# Patient Record
Sex: Male | Born: 1970 | Race: White | Hispanic: No | Marital: Married | Smoking: Current every day smoker
Health system: Southern US, Community
[De-identification: ages and names within clinical notes are randomized; demographics above are authoritative.]

## PROBLEM LIST (undated history)

## (undated) ENCOUNTER — Emergency Department (HOSPITAL_COMMUNITY): Admission: EM | Payer: Self-pay | Source: Home / Self Care

## (undated) DIAGNOSIS — G4730 Sleep apnea, unspecified: Secondary | ICD-10-CM

## (undated) DIAGNOSIS — J449 Chronic obstructive pulmonary disease, unspecified: Secondary | ICD-10-CM

## (undated) DIAGNOSIS — F419 Anxiety disorder, unspecified: Secondary | ICD-10-CM

## (undated) DIAGNOSIS — M545 Low back pain, unspecified: Secondary | ICD-10-CM

## (undated) DIAGNOSIS — K648 Other hemorrhoids: Secondary | ICD-10-CM

## (undated) DIAGNOSIS — K2100 Gastro-esophageal reflux disease with esophagitis, without bleeding: Secondary | ICD-10-CM

## (undated) DIAGNOSIS — M542 Cervicalgia: Secondary | ICD-10-CM

## (undated) DIAGNOSIS — M549 Dorsalgia, unspecified: Secondary | ICD-10-CM

## (undated) DIAGNOSIS — G8929 Other chronic pain: Secondary | ICD-10-CM

## (undated) DIAGNOSIS — F329 Major depressive disorder, single episode, unspecified: Secondary | ICD-10-CM

## (undated) DIAGNOSIS — F32A Depression, unspecified: Secondary | ICD-10-CM

## (undated) DIAGNOSIS — K219 Gastro-esophageal reflux disease without esophagitis: Secondary | ICD-10-CM

## (undated) HISTORY — DX: Major depressive disorder, single episode, unspecified: F32.9

## (undated) HISTORY — PX: KNEE SURGERY: SHX244

## (undated) HISTORY — DX: Other hemorrhoids: K64.8

## (undated) HISTORY — DX: Chronic obstructive pulmonary disease, unspecified: J44.9

## (undated) HISTORY — DX: Gastro-esophageal reflux disease with esophagitis, without bleeding: K21.00

## (undated) HISTORY — DX: Low back pain, unspecified: M54.50

## (undated) HISTORY — DX: Sleep apnea, unspecified: G47.30

---
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
## 2003-09-02 ENCOUNTER — Ambulatory Visit: Admission: RE | Admit: 2003-09-02 | Discharge: 2003-09-02 | Payer: Self-pay | Admitting: Pulmonary Disease

## 2003-09-06 ENCOUNTER — Ambulatory Visit (HOSPITAL_COMMUNITY): Admission: RE | Admit: 2003-09-06 | Discharge: 2003-09-06 | Payer: Self-pay | Admitting: Pulmonary Disease

## 2003-12-11 ENCOUNTER — Ambulatory Visit (HOSPITAL_COMMUNITY): Admission: RE | Admit: 2003-12-11 | Discharge: 2003-12-11 | Payer: Self-pay | Admitting: Orthopedic Surgery

## 2003-12-21 ENCOUNTER — Encounter (HOSPITAL_COMMUNITY): Admission: RE | Admit: 2003-12-21 | Discharge: 2004-01-20 | Payer: Self-pay | Admitting: Orthopedic Surgery

## 2005-06-25 ENCOUNTER — Ambulatory Visit (HOSPITAL_COMMUNITY): Admission: RE | Admit: 2005-06-25 | Discharge: 2005-06-25 | Payer: Self-pay | Admitting: Pulmonary Disease

## 2005-07-10 ENCOUNTER — Ambulatory Visit (HOSPITAL_COMMUNITY): Admission: RE | Admit: 2005-07-10 | Discharge: 2005-07-10 | Payer: Self-pay | Admitting: Pulmonary Disease

## 2007-10-13 ENCOUNTER — Ambulatory Visit (HOSPITAL_COMMUNITY): Admission: RE | Admit: 2007-10-13 | Discharge: 2007-10-13 | Payer: Self-pay | Admitting: Pulmonary Disease

## 2008-09-11 ENCOUNTER — Ambulatory Visit (HOSPITAL_COMMUNITY): Admission: RE | Admit: 2008-09-11 | Discharge: 2008-09-11 | Payer: Self-pay | Admitting: Pulmonary Disease

## 2008-12-13 ENCOUNTER — Ambulatory Visit (HOSPITAL_COMMUNITY): Admission: RE | Admit: 2008-12-13 | Discharge: 2008-12-13 | Payer: Self-pay | Admitting: Pulmonary Disease

## 2009-01-07 ENCOUNTER — Encounter (HOSPITAL_COMMUNITY): Admission: RE | Admit: 2009-01-07 | Discharge: 2009-02-06 | Payer: Self-pay | Admitting: Pulmonary Disease

## 2009-03-15 ENCOUNTER — Encounter
Admission: RE | Admit: 2009-03-15 | Discharge: 2009-03-15 | Payer: Self-pay | Admitting: Physical Medicine & Rehabilitation

## 2009-07-19 ENCOUNTER — Ambulatory Visit (HOSPITAL_COMMUNITY)
Admission: RE | Admit: 2009-07-19 | Discharge: 2009-07-19 | Payer: Self-pay | Admitting: Physical Medicine and Rehabilitation

## 2010-09-03 ENCOUNTER — Ambulatory Visit (HOSPITAL_COMMUNITY)
Admission: RE | Admit: 2010-09-03 | Discharge: 2010-09-03 | Payer: Self-pay | Source: Home / Self Care | Attending: Pulmonary Disease | Admitting: Pulmonary Disease

## 2010-12-06 ENCOUNTER — Emergency Department (HOSPITAL_COMMUNITY)
Admission: EM | Admit: 2010-12-06 | Discharge: 2010-12-06 | Disposition: A | Discharge status: Home / Self Care | Payer: Medicaid Other | Attending: Emergency Medicine | Admitting: Emergency Medicine

## 2010-12-06 DIAGNOSIS — N489 Disorder of penis, unspecified: Secondary | ICD-10-CM | POA: Insufficient documentation

## 2010-12-06 DIAGNOSIS — Y929 Unspecified place or not applicable: Secondary | ICD-10-CM | POA: Insufficient documentation

## 2010-12-06 DIAGNOSIS — Z79899 Other long term (current) drug therapy: Secondary | ICD-10-CM | POA: Insufficient documentation

## 2010-12-06 DIAGNOSIS — N4889 Other specified disorders of penis: Secondary | ICD-10-CM | POA: Insufficient documentation

## 2010-12-06 DIAGNOSIS — N471 Phimosis: Secondary | ICD-10-CM | POA: Insufficient documentation

## 2010-12-06 DIAGNOSIS — S30860A Insect bite (nonvenomous) of lower back and pelvis, initial encounter: Secondary | ICD-10-CM | POA: Insufficient documentation

## 2010-12-06 DIAGNOSIS — N478 Other disorders of prepuce: Secondary | ICD-10-CM | POA: Insufficient documentation

## 2010-12-06 DIAGNOSIS — W57XXXA Bitten or stung by nonvenomous insect and other nonvenomous arthropods, initial encounter: Secondary | ICD-10-CM | POA: Insufficient documentation

## 2011-01-27 ENCOUNTER — Ambulatory Visit (INDEPENDENT_AMBULATORY_CARE_PROVIDER_SITE_OTHER): Payer: Medicaid Other | Admitting: Orthopedic Surgery

## 2011-01-27 ENCOUNTER — Encounter: Payer: Self-pay | Admitting: Orthopedic Surgery

## 2011-01-27 VITALS — Ht 69.0 in | Wt 209.0 lb

## 2011-01-27 DIAGNOSIS — S40011A Contusion of right shoulder, initial encounter: Secondary | ICD-10-CM | POA: Insufficient documentation

## 2011-01-27 DIAGNOSIS — S40019A Contusion of unspecified shoulder, initial encounter: Secondary | ICD-10-CM

## 2011-01-27 MED ORDER — IBUPROFEN 800 MG PO TABS: 800.0000 mg | ORAL_TABLET | Freq: Three times a day (TID) | ORAL | Status: AC | PRN

## 2011-01-27 MED ORDER — HYDROCODONE-ACETAMINOPHEN 5-325 MG PO TABS: 1.0000 | ORAL_TABLET | Freq: Four times a day (QID) | ORAL | Status: DC | PRN

## 2011-01-27 NOTE — Progress Notes (Signed)
Chief complaint is RIGHT shoulder pain.  History this is a 40 year old male, who was cutting a tree down in the limb hit him in the RIGHT arm near his upper deltoid and shoulder. He's had pain since that time for the last 3-4 months with sharp constant throbbing sensation associated with tingling in the upper arm. It's worse with use is better if he elevates the arm. X-rays have been taken of his neck humerus and shoulder and they were normal. No fracture.  Medical system review was completed and included the following complaints snoring, and anxiety, although systems were negative.  Exam showed stable vital signs normal appearance. He was oriented x3. His mood was flat.  This ambulation was normal.  The RIGHT shoulder was tender over the deltoid and the lateral acromion. His flexion was only 110 his abduction is 70 his external rotation is 40. The shoulder was stable, however. Muscle tone was normal. Internal and external rotation strength is normal. Abduction strength is normal. Skin was intact. Pulses in temperature RIGHT upper extremity normal. Supraclavicular lymph nodes were normal.  He had decreased sensation of the lateral arm suggestive of axillary nerve compression injury. Coordination and balance are normal.  X-rays were reviewed from the hospital include shoulder and humerus. Normal.  Impression contusion, RIGHT shoulder.  Recommend ibuprofen and Norco for pain, along with physical therapy. No followup needed.

## 2011-01-27 NOTE — Patient Instructions (Addendum)
Start PHYSICAL THERAPY x 8 weeks   No return scheduled

## 2011-02-06 NOTE — Op Note (Signed)
NAME:  Chad Glass, Chad Glass                         ACCOUNT NO.:  [id number]   MEDICAL RECORD NO.:  [id number]                   PATIENT TYPE:  AMB   LOCATION:  DAY                                  FACILITY:  [facility]   PHYSICIAN:  Vickki Hearing, M.D.           DATE OF BIRTH:  08/27/1971   DATE OF PROCEDURE:  DATE OF DISCHARGE:                                 OPERATIVE REPORT   PREOPERATIVE DIAGNOSIS:  Medial meniscal tear, right knee.   POSTOPERATIVE DIAGNOSIS:  Medial meniscal tear, right knee.   PROCEDURE:  1. Arthroscopy.  2. Partial medial meniscectomy.   SURGEON:  Vickki Hearing, M.D.   ANESTHETIC:  General.   ARTHROSCOPIC FINDINGS:  Torn posterior horn medial meniscus.   HISTORY:  A 40 year old male with right knee pain treated conservatively,  initially, did not improve.  MRI was obtained and showed a torn medial  meniscus; presented for surgery.   INDICATIONS:  Pain and knee dysfunction.   DESCRIPTION OF PROCEDURE:  [name] was identified in the holding area. I  placed my initials over his marked surgical site which was the right knee.  We reviewed his medical record.  He signed a consent for right knee  arthroscopy.  His arm band was used to identify him.  He was given Ancef;  taken to operating room for general anesthetic. He had a sterile prep and  drape.  We took a time out to confirm the patient's identity, procedure, and  limb; everyone agreed; we proceeded with right knee arthroscopy.   Using a 3-incision technique we did a diagnostic arthroscopy.  The only  pathologic findings was a torn medial meniscus.  We used the straight punch  forceps to morselize the meniscal tear and then a aggressive meniscus blade  to clean up the debris and balance the meniscus.  A probe was used to test  the stability of the remaining rim. It was  stable.  The knee was irrigated, suctioned, and closed with Steri-Strips.  We injected 30 cc of Sensorcaine; applied a  CryoCuff, over a sterile  dressing; extubated him and took him to recovery room in stable condition.   POSTOP PLAN:  Full weightbearing, CryoCuff therapy, follow up in 2 days for  dressing change.     ___________________________________________                                            Vickki Hearing, M.D.   SEH/MEDQ  D:  12/11/2003  T:  12/11/2003  Job:  [id number]

## 2011-10-09 ENCOUNTER — Other Ambulatory Visit: Payer: Self-pay | Admitting: Orthopedic Surgery

## 2011-11-23 ENCOUNTER — Encounter (HOSPITAL_COMMUNITY): Payer: Self-pay

## 2011-11-23 ENCOUNTER — Encounter (HOSPITAL_COMMUNITY): Payer: Self-pay | Admitting: Pharmacy Technician

## 2011-11-23 ENCOUNTER — Encounter (HOSPITAL_COMMUNITY)
Admission: RE | Admit: 2011-11-23 | Discharge: 2011-11-23 | Disposition: A | Discharge status: Home / Self Care | Payer: Medicaid Other | Source: Ambulatory Visit | Attending: General Surgery | Admitting: General Surgery

## 2011-11-23 HISTORY — DX: Gastro-esophageal reflux disease without esophagitis: K21.9

## 2011-11-23 HISTORY — DX: Cervicalgia: M54.2

## 2011-11-23 HISTORY — DX: Anxiety disorder, unspecified: F41.9

## 2011-11-23 HISTORY — DX: Dorsalgia, unspecified: M54.9

## 2011-11-23 HISTORY — DX: Depression, unspecified: F32.A

## 2011-11-23 HISTORY — DX: Other chronic pain: G89.29

## 2011-11-23 HISTORY — DX: Major depressive disorder, single episode, unspecified: F32.9

## 2011-11-23 HISTORY — DX: Sleep apnea, unspecified: G47.30

## 2011-11-23 LAB — DIFFERENTIAL
Basophils Relative: 0 % (ref 0–1)
Eosinophils Absolute: 0.2 10*3/uL (ref 0.0–0.7)
Lymphs Abs: 3.3 10*3/uL (ref 0.7–4.0)
Monocytes Relative: 6 % (ref 3–12)
Neutro Abs: 4.2 10*3/uL (ref 1.7–7.7)
Neutrophils Relative %: 52 % (ref 43–77)

## 2011-11-23 LAB — SURGICAL PCR SCREEN
MRSA, PCR: NEGATIVE
Staphylococcus aureus: NEGATIVE

## 2011-11-23 LAB — BASIC METABOLIC PANEL
BUN: 13 mg/dL (ref 6–23)
CO2: 21 mEq/L (ref 19–32)
Calcium: 9.3 mg/dL (ref 8.4–10.5)
Chloride: 106 mEq/L (ref 96–112)
Creatinine, Ser: 1.03 mg/dL (ref 0.50–1.35)
GFR calc Af Amer: >90 mL/min (ref >90)
GFR calc non Af Amer: 89 mL/min — ABNORMAL LOW (ref >90)
Glucose, Bld: 91 mg/dL (ref 70–99)
Potassium: 3.6 mEq/L (ref 3.5–5.1)
Sodium: 139 mEq/L (ref 135–145)

## 2011-11-23 LAB — CBC
Hemoglobin: 14.7 g/dL (ref 13.0–17.0)
MCH: 31.3 pg (ref 26.0–34.0)
MCHC: 33.7 g/dL (ref 30.0–36.0)
Platelets: 216 10*3/uL (ref 150–400)
RBC: 4.69 MIL/uL (ref 4.22–5.81)

## 2011-11-23 NOTE — Patient Instructions (Addendum)
[address]  11/23/2011   Your procedure is scheduled on:  11-30-11  Report to [facility] at 06:15 AM.  Call this number if you have problems the morning of surgery: [phone]   Remember:   Do not eat food:After Midnight.  May have clear liquids:until Midnight .  Clear liquids include soda, tea, black coffee, apple or grape juice, broth.  Take these medicines the morning of surgery with A SIP OF WATER: xanax   Do not wear jewelry, make-up or nail polish.  Do not wear lotions, powders, or perfumes. You may wear deodorant.  Do not shave 48 hours prior to surgery.  Do not bring valuables to the hospital.  Contacts, dentures or bridgework may not be worn into surgery.  Leave suitcase in the car. After surgery it may be brought to your room.  For patients admitted to the hospital, checkout time is 11:00 AM the day of discharge.   Patients discharged the day of surgery will not be allowed to drive home.  Name and phone number of your driver: family  Special Instructions: CHG Shower Use Special Wash: 1/2 bottle night before surgery and 1/2 bottle morning of surgery.   Please read over the following fact sheets that you were given: Pain Booklet, MRSA Information, Surgical Site Infection Prevention, Anesthesia Post-op Instructions and Care and Recovery After Surgery    PATIENT INSTRUCTIONS POST-ANESTHESIA  IMMEDIATELY FOLLOWING SURGERY:  Do not drive or operate machinery for the first twenty four hours after surgery.  Do not make any important decisions for twenty four hours after surgery or while taking narcotic pain medications or sedatives.  If you develop intractable nausea and vomiting or a severe headache please notify your doctor immediately.  FOLLOW-UP:  Please make an appointment with your surgeon as instructed. You do not need to follow up with anesthesia unless specifically instructed to do so.  WOUND CARE INSTRUCTIONS (if applicable):  Keep a dry clean dressing on the  anesthesia/puncture wound site if there is drainage.  Once the wound has quit draining you may leave it open to air.  Generally you should leave the bandage intact for twenty four hours unless there is drainage.  If the epidural site drains for more than 36-48 hours please call the anesthesia department.  QUESTIONS?:  Please feel free to call your physician or the hospital operator if you have any questions, and they will be happy to assist you.     [facility] Anesthesia Department [address] [place] [phone]

## 2011-11-23 NOTE — H&P (Signed)
  NTS SOAP Note  Vital Signs:  Vitals as of: 11/17/2011: Systolic 145: Diastolic 86: Heart Rate 78: Temp 31F: Height 42ft 9in: Weight 240Lbs 0 Ounces: OFC 0in: Respiratory Rate 0: O2 Saturation 0: Pain Level 4: BMI 35  BMI : 35.44 kg/m2  Subjective: This 41 Years 50 Months old Male presents for of Abdominal bulge in his bellybutton.Patient noted this approximately 6-8 months ago. He states he was in the shower when he noticed a hard lump by his bellybutton. This intermittently gets softer and more firm. He does occasionally have some discomfort with excessive straining. He's had no associated symptomatologies and nausea, vomiting, bowel changes. No melena no hematochezia. No signs or symptoms of incarceration or strangulation.  Review of Symptoms:  Constitutional:unremarkable Headaches Eyes:vision problems night Nose/Mouth/Throat:unremarkable Cardiovascular:unremarkable Short of breath Gastrointestinal:unremarkable Genitourinary:unremarkable Neck, back, joint arthralgias Skin:unremarkable Breast:unremarkable Tired, Easily fatigued Allergic/Immunologic:unremarkable   Past Medical History:Obtained   Past Medical History  Surgical History: Right knee arthroscopy Medical Problems: none Psychiatric History: none Allergies: NKDA Medications: oxycodone   Social History:Obtained   Social History  Preferred Language: English ([place]) Race:  White Ethnicity: Not Hispanic / Latino Age: 41 Years 4 Months Alcohol: no Recreational drug(s): no   Smoking Status: Current every day smoker reviewed on 11/18/2011 Started Date: 09/22/1987 Packs per day: 1.50   Family History:Obtained   Family History              Father: DM2    Objective Information: General:Well appearing, well nourished in no distress.Moderately obese. Disheveled Skin:no rash or prominent lesions Head:Atraumatic; no masses; no  abnormalities Eyes:conjunctiva clear, EOM intact, PERRL Mouth:Mucous membranes moist, no mucosal lesions. Neck:Supple without lymphadenopathy.  Heart:RRR, no murmur Lungs:CTA bilaterally, no wheezes, rhonchi, rales.  Breathing unlabored. Abdomen:Soft, NT/ND, no HSM, no masses.Patient has a moderate size reducible umbilical hernia. Mild discomfort with reduction of the hernia. No diffuse peritoneal signs. No inguinal hernias apparent. Normal male appearing external genitalia. Extremities:No deformities, clubbing, cyanosis, or edema.   Assessment:  Diagnosis &amp; Procedure: DiagnosisCode: 553.1, ProcedureCode: 84696,    Plan: Umbilical hernia. Risks benefits alternatives of an umbilical hernia repair with possible mesh were discussed at length the patient. Indications to proceed to the operating room were discussed. Patient will consider timing and we'll schedule at his convenience. Signs and symptoms of incarceration and strangulation were discussed with the patient patient understands to present to the emergency department should the symptoms appear.  Patient Education:Alternative treatments to surgery were discussed with patient (and family).Risks and benefits  of procedure were fully explained to the patient (and family) who gave informed consent. Patient/family questions were addressed.  Follow-up:Pending Surgery                                             Active Diagnosis and Procedures: 553.1 Umbilical hernia without mention of obstruction or gangrene   29528 - OFFICE OUTPATIENT NEW 30 MINUTES        This note has been electronically signed by Fabio Bering MD 11/18/2011 10:14 AM

## 2011-11-30 ENCOUNTER — Ambulatory Visit (HOSPITAL_COMMUNITY): Payer: Medicaid Other | Admitting: Anesthesiology

## 2011-11-30 ENCOUNTER — Encounter (HOSPITAL_COMMUNITY): Payer: Self-pay | Admitting: Anesthesiology

## 2011-11-30 ENCOUNTER — Encounter (HOSPITAL_COMMUNITY)
Admission: RE | Disposition: A | Discharge status: Home / Self Care | Payer: Self-pay | Source: Ambulatory Visit | Attending: General Surgery

## 2011-11-30 ENCOUNTER — Ambulatory Visit (HOSPITAL_COMMUNITY)
Admission: RE | Admit: 2011-11-30 | Discharge: 2011-11-30 | Disposition: A | Discharge status: Home / Self Care | Payer: Medicaid Other | Source: Ambulatory Visit | Attending: General Surgery | Admitting: General Surgery

## 2011-11-30 ENCOUNTER — Encounter (HOSPITAL_COMMUNITY): Payer: Self-pay | Admitting: *Deleted

## 2011-11-30 DIAGNOSIS — Z01812 Encounter for preprocedural laboratory examination: Secondary | ICD-10-CM | POA: Insufficient documentation

## 2011-11-30 DIAGNOSIS — K429 Umbilical hernia without obstruction or gangrene: Secondary | ICD-10-CM | POA: Insufficient documentation

## 2011-11-30 HISTORY — PX: UMBILICAL HERNIA REPAIR: SHX196

## 2011-11-30 SURGERY — REPAIR, HERNIA, UMBILICAL, ADULT
Anesthesia: General | Site: Abdomen | Wound class: Clean

## 2011-11-30 MED ORDER — HYDROCODONE-ACETAMINOPHEN 5-325 MG PO TABS: 1.0000 | ORAL_TABLET | ORAL | Status: AC | PRN

## 2011-11-30 MED ORDER — BUPIVACAINE HCL (PF) 0.5 % IJ SOLN
INTRAMUSCULAR | Status: AC
  Filled 2011-11-30: qty 30

## 2011-11-30 MED ORDER — PROPOFOL 10 MG/ML IV BOLUS
INTRAVENOUS | Status: DC | PRN
  Administered 2011-11-30: 160 mg via INTRAVENOUS

## 2011-11-30 MED ORDER — LIDOCAINE HCL (PF) 1 % IJ SOLN
INTRAMUSCULAR | Status: AC
  Filled 2011-11-30: qty 5

## 2011-11-30 MED ORDER — BUPIVACAINE HCL (PF) 0.5 % IJ SOLN
INTRAMUSCULAR | Status: DC | PRN
  Administered 2011-11-30: 10 mL

## 2011-11-30 MED ORDER — CELECOXIB 100 MG PO CAPS
ORAL_CAPSULE | ORAL | Status: AC
  Administered 2011-11-30: 400 mg via ORAL
  Filled 2011-11-30: qty 4

## 2011-11-30 MED ORDER — CELECOXIB 100 MG PO CAPS
400.0000 mg | ORAL_CAPSULE | Freq: Every day | ORAL | Status: AC
  Administered 2011-11-30: 400 mg via ORAL

## 2011-11-30 MED ORDER — CEFAZOLIN SODIUM 1-5 GM-% IV SOLN: 1.0000 g | INTRAVENOUS | Status: DC

## 2011-11-30 MED ORDER — MIDAZOLAM HCL 2 MG/2ML IJ SOLN
INTRAMUSCULAR | Status: AC
  Administered 2011-11-30: 2 mg via INTRAVENOUS
  Filled 2011-11-30: qty 2

## 2011-11-30 MED ORDER — ONDANSETRON HCL 4 MG/2ML IJ SOLN: 4.0000 mg | Freq: Once | INTRAMUSCULAR | Status: DC | PRN

## 2011-11-30 MED ORDER — FENTANYL CITRATE 0.05 MG/ML IJ SOLN
25.0000 ug | INTRAMUSCULAR | Status: DC | PRN
  Administered 2011-11-30 (×2): 50 ug via INTRAVENOUS

## 2011-11-30 MED ORDER — CEFAZOLIN SODIUM 1-5 GM-% IV SOLN
INTRAVENOUS | Status: DC | PRN
  Administered 2011-11-30: 1 g via INTRAVENOUS

## 2011-11-30 MED ORDER — NEOSTIGMINE METHYLSULFATE 1 MG/ML IJ SOLN
INTRAMUSCULAR | Status: AC
  Filled 2011-11-30: qty 10

## 2011-11-30 MED ORDER — MIDAZOLAM HCL 5 MG/5ML IJ SOLN
INTRAMUSCULAR | Status: DC | PRN
  Administered 2011-11-30: 2 mg via INTRAVENOUS

## 2011-11-30 MED ORDER — GLYCOPYRROLATE 0.2 MG/ML IJ SOLN
INTRAMUSCULAR | Status: DC | PRN
  Administered 2011-11-30: .4 mg via INTRAVENOUS

## 2011-11-30 MED ORDER — NEOSTIGMINE METHYLSULFATE 1 MG/ML IJ SOLN
INTRAMUSCULAR | Status: DC | PRN
  Administered 2011-11-30: 3 mg via INTRAVENOUS

## 2011-11-30 MED ORDER — ROCURONIUM BROMIDE 100 MG/10ML IV SOLN
INTRAVENOUS | Status: DC | PRN
  Administered 2011-11-30: 30 mg via INTRAVENOUS

## 2011-11-30 MED ORDER — ENOXAPARIN SODIUM 40 MG/0.4ML ~~LOC~~ SOLN
40.0000 mg | Freq: Once | SUBCUTANEOUS | Status: AC
  Administered 2011-11-30: 40 mg via SUBCUTANEOUS

## 2011-11-30 MED ORDER — FENTANYL CITRATE 0.05 MG/ML IJ SOLN
INTRAMUSCULAR | Status: AC
  Filled 2011-11-30: qty 2

## 2011-11-30 MED ORDER — ROCURONIUM BROMIDE 50 MG/5ML IV SOLN
INTRAVENOUS | Status: AC
  Filled 2011-11-30: qty 1

## 2011-11-30 MED ORDER — FENTANYL CITRATE 0.05 MG/ML IJ SOLN
INTRAMUSCULAR | Status: DC | PRN
  Administered 2011-11-30 (×2): 100 ug via INTRAVENOUS

## 2011-11-30 MED ORDER — SODIUM CHLORIDE 0.9 % IR SOLN
Status: DC | PRN
  Administered 2011-11-30: 1000 mL

## 2011-11-30 MED ORDER — MIDAZOLAM HCL 2 MG/2ML IJ SOLN
INTRAMUSCULAR | Status: AC
  Filled 2011-11-30: qty 2

## 2011-11-30 MED ORDER — GLYCOPYRROLATE 0.2 MG/ML IJ SOLN
INTRAMUSCULAR | Status: AC
  Filled 2011-11-30: qty 1

## 2011-11-30 MED ORDER — ENOXAPARIN SODIUM 40 MG/0.4ML ~~LOC~~ SOLN
SUBCUTANEOUS | Status: AC
  Administered 2011-11-30: 40 mg via SUBCUTANEOUS
  Filled 2011-11-30: qty 0.4

## 2011-11-30 MED ORDER — PROPOFOL 10 MG/ML IV EMUL
INTRAVENOUS | Status: AC
  Filled 2011-11-30: qty 20

## 2011-11-30 MED ORDER — LACTATED RINGERS IV SOLN
INTRAVENOUS | Status: DC
  Administered 2011-11-30: 1000 mL via INTRAVENOUS

## 2011-11-30 MED ORDER — FENTANYL CITRATE 0.05 MG/ML IJ SOLN
INTRAMUSCULAR | Status: AC
  Administered 2011-11-30: 50 ug via INTRAVENOUS
  Filled 2011-11-30: qty 2

## 2011-11-30 MED ORDER — LIDOCAINE HCL 1 % IJ SOLN
INTRAMUSCULAR | Status: DC | PRN
  Administered 2011-11-30: 25 mg via INTRADERMAL

## 2011-11-30 MED ORDER — CEFAZOLIN SODIUM 1-5 GM-% IV SOLN
INTRAVENOUS | Status: AC
  Filled 2011-11-30: qty 50

## 2011-11-30 MED ORDER — MIDAZOLAM HCL 2 MG/2ML IJ SOLN
1.0000 mg | INTRAMUSCULAR | Status: DC | PRN
  Administered 2011-11-30: 2 mg via INTRAVENOUS

## 2011-11-30 SURGICAL SUPPLY — 33 items
BAG HAMPER (MISCELLANEOUS) ×2 IMPLANT
BENZOIN TINCTURE PRP APPL 2/3 (GAUZE/BANDAGES/DRESSINGS) ×2 IMPLANT
CLOTH BEACON ORANGE TIMEOUT ST (SAFETY) ×2 IMPLANT
COVER LIGHT HANDLE STERIS (MISCELLANEOUS) ×4 IMPLANT
DECANTER SPIKE VIAL GLASS SM (MISCELLANEOUS) ×2 IMPLANT
DURAPREP 26ML APPLICATOR (WOUND CARE) ×2 IMPLANT
ELECT REM PT RETURN 9FT ADLT (ELECTROSURGICAL) ×2
ELECTRODE REM PT RTRN 9FT ADLT (ELECTROSURGICAL) ×1 IMPLANT
GLOVE BIOGEL PI IND STRL 7.5 (GLOVE) ×1 IMPLANT
GLOVE BIOGEL PI INDICATOR 7.5 (GLOVE) ×1
GLOVE ECLIPSE 6.5 STRL STRAW (GLOVE) ×2 IMPLANT
GLOVE ECLIPSE 7.0 STRL STRAW (GLOVE) ×2 IMPLANT
GLOVE EXAM NITRILE MD LF STRL (GLOVE) ×2 IMPLANT
GLOVE INDICATOR 7.0 STRL GRN (GLOVE) ×2 IMPLANT
GOWN STRL REIN XL XLG (GOWN DISPOSABLE) ×4 IMPLANT
INST SET MINOR GENERAL (KITS) ×2 IMPLANT
KIT ROOM TURNOVER APOR (KITS) ×2 IMPLANT
MANIFOLD NEPTUNE II (INSTRUMENTS) ×2 IMPLANT
NEEDLE HYPO 25X1 1.5 SAFETY (NEEDLE) ×2 IMPLANT
NS IRRIG 1000ML POUR BTL (IV SOLUTION) ×2 IMPLANT
PACK MINOR (CUSTOM PROCEDURE TRAY) ×2 IMPLANT
PAD ARMBOARD 7.5X6 YLW CONV (MISCELLANEOUS) ×2 IMPLANT
SET BASIN LINEN APH (SET/KITS/TRAYS/PACK) ×2 IMPLANT
STRIP CLOSURE SKIN 1/2X4 (GAUZE/BANDAGES/DRESSINGS) ×2 IMPLANT
SUT ETHIBOND CT1 BRD 2-0 30IN (SUTURE) IMPLANT
SUT ETHIBOND NAB MO 7 #0 18IN (SUTURE) ×2 IMPLANT
SUT MNCRL AB 4-0 PS2 18 (SUTURE) ×2 IMPLANT
SUT VIC AB 2-0 CT2 27 (SUTURE) ×2 IMPLANT
SUT VIC AB 3-0 SH 27 (SUTURE) ×1
SUT VIC AB 3-0 SH 27X BRD (SUTURE) ×1 IMPLANT
SUT VICRYL AB 3 0 TIES (SUTURE) IMPLANT
SYR BULB IRRIGATION 50ML (SYRINGE) IMPLANT
SYR CONTROL 10ML LL (SYRINGE) ×2 IMPLANT

## 2011-11-30 NOTE — Anesthesia Procedure Notes (Signed)
Procedure Name: Intubation Date/Time: 11/30/2011 7:59 AM Performed by: Despina Hidden Pre-anesthesia Checklist: Patient identified, Patient being monitored, Emergency Drugs available and Suction available Patient Re-evaluated:Patient Re-evaluated prior to inductionOxygen Delivery Method: Circle system utilized Preoxygenation: Pre-oxygenation with 100% oxygen Intubation Type: IV induction and Cricoid Pressure applied Ventilation: Mask ventilation without difficulty Laryngoscope Size: 3 and Mac Grade View: Grade I Tube type: Oral Tube size: 8.0 mm Number of attempts: 1 Airway Equipment and Method: Stylet Placement Confirmation: positive ETCO2,  breath sounds checked- equal and bilateral and ETT inserted through vocal cords under direct vision Secured at: 22 cm Tube secured with: Tape Dental Injury: Teeth and Oropharynx as per pre-operative assessment

## 2011-11-30 NOTE — Anesthesia Preprocedure Evaluation (Signed)
Anesthesia Evaluation  Patient identified by MRN, date of birth, ID band Patient awake    Reviewed: Allergy & Precautions, H&P , NPO status , Patient's Chart, lab work & pertinent test results  History of Anesthesia Complications Negative for: history of anesthetic complications  Airway Mallampati: II      Dental  (+) Poor Dentition, Chipped, Missing and Dental Advisory Given   Pulmonary sleep apnea and Continuous Positive Airway Pressure Ventilation , Current Smoker,  breath sounds clear to auscultation        Cardiovascular negative cardio ROS  Rhythm:Regular     Neuro/Psych PSYCHIATRIC DISORDERS Anxiety Depression    GI/Hepatic GERD-  ,  Endo/Other    Renal/GU      Musculoskeletal   Abdominal   Peds  Hematology   Anesthesia Other Findings   Reproductive/Obstetrics                           Anesthesia Physical Anesthesia Plan  ASA: III  Anesthesia Plan: General   Post-op Pain Management:    Induction: Intravenous, Rapid sequence and Cricoid pressure planned  Airway Management Planned: Oral ETT  Additional Equipment:   Intra-op Plan:   Post-operative Plan:   Informed Consent: I have reviewed the patients History and Physical, chart, labs and discussed the procedure including the risks, benefits and alternatives for the proposed anesthesia with the patient or authorized representative who has indicated his/her understanding and acceptance.     Plan Discussed with:   Anesthesia Plan Comments:         Anesthesia Quick Evaluation

## 2011-11-30 NOTE — Progress Notes (Signed)
Awake. Talking. Denies pain. "I just want a cigarette."  Wants to go home.

## 2011-11-30 NOTE — Anesthesia Postprocedure Evaluation (Signed)
  Anesthesia Post-op Note  Patient: Chad Glass  Procedure(s) Performed: Procedure(s) (LRB): HERNIA REPAIR UMBILICAL ADULT (N/A)  Patient Location: PACU  Anesthesia Type: General  Level of Consciousness: awake, alert , oriented and patient cooperative  Airway and Oxygen Therapy: Patient Spontanous Breathing  Post-op Pain: 2 /10, mild  Post-op Assessment: Post-op Vital signs reviewed, Patient's Cardiovascular Status Stable, Respiratory Function Stable, Patent Airway and No signs of Nausea or vomiting  Post-op Vital Signs: Reviewed and stable  Complications: No apparent anesthesia complications

## 2011-11-30 NOTE — Interval H&P Note (Signed)
History and Physical Interval Note:  11/30/2011 7:49 AM  Chad Glass  has presented today for surgery, with the diagnosis of Umbilical hernia  The various methods of treatment have been discussed with the patient and family. After consideration of risks, benefits and other options for treatment, the patient has consented to  Procedure(s) (LRB): HERNIA REPAIR UMBILICAL ADULT (N/A) as a surgical intervention .  The patients' history has been reviewed, patient examined, no change in status, stable for surgery.  I have reviewed the patients' chart and labs.  Questions were answered to the patient's satisfaction.     Kelsie Zaborowski C

## 2011-11-30 NOTE — Transfer of Care (Signed)
Immediate Anesthesia Transfer of Care Note  Patient: Chad Glass  Procedure(s) Performed: Procedure(s) (LRB): HERNIA REPAIR UMBILICAL ADULT (N/A)  Patient Location: PACU  Anesthesia Type: General  Level of Consciousness: awake and patient cooperative  Airway & Oxygen Therapy: Patient Spontanous Breathing and Patient connected to face mask oxygen  Post-op Assessment: Report given to PACU RN, Post -op Vital signs reviewed and stable and Patient moving all extremities  Post vital signs: Reviewed and stable  Complications: No apparent anesthesia complications

## 2011-11-30 NOTE — Op Note (Signed)
Patient:  Chad Glass  DOB:  1971/06/27  MRN:  [id number]   Preop Diagnosis:  Umbilical hernia  Postop Diagnosis:  The same  Procedure:  Umbilical hernia repair  Surgeon:  Dr. Tilford Pillar  Anes:  General endotracheal, 0.5% Sensorcaine plain for local  Indications:  Patient is a 41 year old male presented my office with a bulge above his umbilicus. He's had some discomfort in this area. Evaluation was consistent for an umbilical hernia. Risks benefits alternatives of repair were discussed at length the patient including but not limited to risk of bleeding, infection, infection and mesh requiring removal if mesh was used, recurrence, intraoperative cardiac and pulmonary events.  Patient was consented for the planned procedure  Procedure note:  Patient was taken to the orin the supine position the operator table time the general anesthetic was a Optician, dispensing. Once patient was asleep he was endotracheally intubated by the nurse anesthetist. At this point his abdomen is prepped with DuraPrep solution and draped in standard fashion. A semilunar incision was created infraumbilically with a 15 blade scalpel. Additional dissection down to subcuticular tissues carried out using electrocautery. Upon entering the fascia did dissect around the umbilical stalk and hernia with a hemostat. Careful dissection was carried out to dissect the hernia sac off the umbilical stalk. During this dissection I did enter into the hernia sac and identified some omental fat. Upon completely freeing the fascial defect circumferentially to reduce the omental fat back into the abdominal cavity. The fascia defect was less than 1 cm I opted at this point to close the defect with interrupted 0 Ethibond sutures. These are placed in simple interrupted fashion. As quite pleased with the repair. The field was irrigated. A 3-0 Vicryl was utilized to reattach the umbilical stalk to the fascia. A 3-0 Vicryl was also utilized in simple or  fashion to reapproximate the deep subcuticular tissue. The skin edges were reapproximated using a 4-0 Monocryl in a running subcuticular suture. The skin was washed dried moist dry towel after the local anesthetic was instilled. Benzoin is applied around incision. Half-inch Steri-Strips are placed. The drapes removed the patient was allowed to mild general anesthetic was transferred to the postanesthetic care unit in stable condition. At the conclusion of procedure all instrument, sponge, needle counts are correct. Patient tolerated procedure extremely well.  Complications:  None apparent  EBL:  Scant  Specimen:  None

## 2011-12-03 ENCOUNTER — Encounter (HOSPITAL_COMMUNITY): Payer: Self-pay | Admitting: General Surgery

## 2015-09-10 LAB — LIPID PANEL
Cholesterol: 135 (ref 0–200)
HDL: 28 — AB (ref 35–70)
LDL Cholesterol: 93
Triglycerides: 68 (ref 40–160)

## 2015-09-10 LAB — PSA: PSA: 0.82

## 2016-02-07 LAB — BASIC METABOLIC PANEL
BUN: 10 (ref 4–21)
CO2: 24 — AB (ref 13–22)
Chloride: 111 — AB (ref 99–108)
Creatinine: 1.1 (ref <=1.3)
Glucose: 130
Potassium: 4.2 (ref 3.4–5.3)
Sodium: 145 (ref 137–147)

## 2016-02-07 LAB — HEPATIC FUNCTION PANEL
ALT: 21 (ref 10–40)
AST: 20 (ref 14–40)
Alkaline Phosphatase: 76 (ref 25–125)
Bilirubin, Total: 0.7

## 2016-02-07 LAB — CBC AND DIFFERENTIAL
HCT: 47 (ref 41–53)
Hemoglobin: 16.2 (ref 13.5–17.5)
Neutrophils Absolute: 5670
Platelets: 223 (ref 150–399)
WBC: 9

## 2016-02-07 LAB — VITAMIN B12: Vitamin B-12: 534

## 2016-02-07 LAB — CBC: RBC: 4.98 (ref 3.87–5.11)

## 2016-02-07 LAB — HEMOGLOBIN A1C: Hemoglobin A1C: 5.8

## 2016-03-04 ENCOUNTER — Other Ambulatory Visit (HOSPITAL_COMMUNITY): Payer: Self-pay | Admitting: Pulmonary Disease

## 2016-03-04 DIAGNOSIS — R4182 Altered mental status, unspecified: Secondary | ICD-10-CM

## 2016-05-15 ENCOUNTER — Encounter (HOSPITAL_COMMUNITY): Payer: Self-pay

## 2016-05-15 ENCOUNTER — Ambulatory Visit (HOSPITAL_COMMUNITY)
Admission: RE | Admit: 2016-05-15 | Discharge: 2016-05-15 | Disposition: A | Discharge status: Home / Self Care | Payer: Medicaid Other | Source: Ambulatory Visit | Attending: Pulmonary Disease | Admitting: Pulmonary Disease

## 2016-05-15 DIAGNOSIS — J328 Other chronic sinusitis: Secondary | ICD-10-CM | POA: Insufficient documentation

## 2016-05-15 DIAGNOSIS — R4182 Altered mental status, unspecified: Secondary | ICD-10-CM | POA: Diagnosis not present

## 2016-11-27 ENCOUNTER — Ambulatory Visit (HOSPITAL_COMMUNITY)
Admission: RE | Admit: 2016-11-27 | Discharge: 2016-11-27 | Disposition: A | Discharge status: Home / Self Care | Payer: Medicaid Other | Source: Ambulatory Visit | Attending: Pulmonary Disease | Admitting: Pulmonary Disease

## 2016-11-27 ENCOUNTER — Other Ambulatory Visit (HOSPITAL_COMMUNITY): Payer: Self-pay | Admitting: Pulmonary Disease

## 2016-11-27 DIAGNOSIS — M545 Low back pain: Secondary | ICD-10-CM | POA: Diagnosis present

## 2016-11-27 DIAGNOSIS — M47812 Spondylosis without myelopathy or radiculopathy, cervical region: Secondary | ICD-10-CM | POA: Insufficient documentation

## 2016-11-27 DIAGNOSIS — M25511 Pain in right shoulder: Secondary | ICD-10-CM | POA: Insufficient documentation

## 2016-11-27 DIAGNOSIS — M5405 Panniculitis affecting regions of neck and back, thoracolumbar region: Secondary | ICD-10-CM | POA: Diagnosis not present

## 2016-11-27 DIAGNOSIS — R52 Pain, unspecified: Secondary | ICD-10-CM

## 2019-03-08 NOTE — Progress Notes (Deleted)
Psychiatric Initial Adult Assessment   Patient Identification: Chad Glass MRN:  [id number] Date of Evaluation:  03/08/2019 Referral Source: Sinda Du, MD Chief Complaint:   Visit Diagnosis: No diagnosis found.  History of Present Illness:   Chad Glass is a 48 y.o. year old male with a history of depression, who is referred for   Xanax 1 mg QID  Associated Signs/Symptoms: Depression Symptoms:  {DEPRESSION SYMPTOMS:20000} (Hypo) Manic Symptoms:  {BHH MANIC SYMPTOMS:22872} Anxiety Symptoms:  {BHH ANXIETY SYMPTOMS:22873} Psychotic Symptoms:  {BHH PSYCHOTIC SYMPTOMS:22874} PTSD Symptoms: {BHH PTSD SYMPTOMS:22875}  Past Psychiatric History:  Outpatient:  Psychiatry admission:  Previous suicide attempt:  Past trials of medication:  History of violence:   Previous Psychotropic Medications: {YES/NO:21197}  Substance Abuse History in the last 12 months:  {yes no:314532}  Consequences of Substance Abuse: {BHH CONSEQUENCES OF SUBSTANCE ABUSE:22880}  Past Medical History:  Past Medical History:  Diagnosis Date  . Anxiety   . Chronic back pain   . Chronic neck pain   . Depression   . GERD (gastroesophageal reflux disease)   . Sleep apnea    CiPaP at night    Past Surgical History:  Procedure Laterality Date  . KNEE SURGERY     right-cut around the cartilage. Dr. Aline Brochure at [place]  11/30/2011   Procedure: HERNIA REPAIR UMBILICAL ADULT;  Surgeon: Donato Heinz, MD;  Location: [facility];  Service: General;  Laterality: N/A;    Family Psychiatric History: ***  Family History:  Family History  Problem Relation Age of Onset  . Arthritis Unknown   . Diabetes Unknown   . Anesthesia problems Neg Hx   . Hypotension Neg Hx   . Pseudochol deficiency Neg Hx   . Malignant hyperthermia Neg Hx     Social History:   Social History   Socioeconomic History  . Marital status: Married    Spouse name: Not on file  . Number of  children: Not on file  . Years of education: 55  . Highest education level: Not on file  Occupational History  . Occupation: unemployed    Fish farm manager: UNEMPLOYED  Social Needs  . Financial resource strain: Not on file  . Food insecurity    Worry: Not on file    Inability: Not on file  . Transportation needs    Medical: Not on file    Non-medical: Not on file  Tobacco Use  . Smoking status: Current Every Day Smoker    Packs/day: 0.75    Years: 30.00    Pack years: 22.50    Types: Cigarettes  Substance and Sexual Activity  . Alcohol use: No  . Drug use: No  . Sexual activity: Not on file  Lifestyle  . Physical activity    Days per week: Not on file    Minutes per session: Not on file  . Stress: Not on file  Relationships  . Social Herbalist on phone: Not on file    Gets together: Not on file    Attends religious service: Not on file    Active member of club or organization: Not on file    Attends meetings of clubs or organizations: Not on file    Relationship status: Not on file  Other Topics Concern  . Not on file  Social History Narrative  . Not on file    Additional Social History: ***  Allergies:  No Known Allergies  Metabolic Disorder Labs:  No results found for: HGBA1C, MPG No results found for: PROLACTIN No results found for: CHOL, TRIG, HDL, CHOLHDL, VLDL, LDLCALC No results found for: TSH  Therapeutic Level Labs: No results found for: LITHIUM No results found for: CBMZ No results found for: VALPROATE  Current Medications: Current Outpatient Medications  Medication Sig Dispense Refill  . ALPRAZolam (XANAX) 1 MG tablet Take 1 mg by mouth 4 (four) times daily as needed. For anxiety    . ibuprofen (ADVIL,MOTRIN) 100 MG tablet Take 200 mg by mouth every 6 (six) hours as needed.    Marland Kitchen. oxyCODONE (ROXICODONE) 15 MG immediate release tablet Take 15 mg by mouth 5 (five) times daily.     No current facility-administered medications for this visit.      Musculoskeletal: Strength & Muscle Tone: N/A Gait & Station: N/A Patient leans: N/A  Psychiatric Specialty Exam: ROS  There were no vitals taken for this visit.There is no height or weight on file to calculate BMI.  General Appearance: {Appearance:22683}  Eye Contact:  {BHH EYE CONTACT:22684}  Speech:  Clear and Coherent  Volume:  Normal  Mood:  {BHH MOOD:22306}  Affect:  {Affect (PAA):22687}  Thought Process:  Coherent  Orientation:  Full (Time, Place, and Person)  Thought Content:  Logical  Suicidal Thoughts:  {ST/HT (PAA):22692}  Homicidal Thoughts:  {ST/HT (PAA):22692}  Memory:  Immediate;   Good  Judgement:  {Judgement (PAA):22694}  Insight:  {Insight (PAA):22695}  Psychomotor Activity:  Normal  Concentration:  Concentration: Good and Attention Span: Good  Recall:  Good  Fund of Knowledge:Good  Language: Good  Akathisia:  No  Handed:  Right  AIMS (if indicated):  not done  Assets:  Communication Skills Desire for Improvement  ADL's:  Intact  Cognition: WNL  Sleep:  {BHH GOOD/FAIR/POOR:22877}   Screenings:   Assessment and Plan:  Assessment  Plan  The patient demonstrates the following risk factors for suicide: Chronic risk factors for suicide include: {Chronic Risk Factors for UEAVWUJ:81191478}Suicide:30414011}. Acute risk factors for suicide include: {Acute Risk Factors for GNFAOZH:08657846}Suicide:30414012}. Protective factors for this patient include: {Protective Factors for Suicide NGEX:52841324}Risk:30414013}. Considering these factors, the overall suicide risk at this point appears to be {Desc; low/moderate/high:110033}. Patient {ACTION; IS/IS MWN:02725366}OT:21021397} appropriate for outpatient follow up.    Neysa Hottereina Mikel Hardgrove, MD 6/17/20203:45 PM

## 2019-03-14 ENCOUNTER — Telehealth (HOSPITAL_COMMUNITY): Payer: Self-pay | Admitting: Psychiatry

## 2019-03-14 ENCOUNTER — Other Ambulatory Visit: Payer: Self-pay

## 2019-03-14 ENCOUNTER — Ambulatory Visit (HOSPITAL_COMMUNITY): Payer: Medicaid Other | Admitting: Psychiatry

## 2019-03-14 NOTE — Telephone Encounter (Signed)
Sent link through doxy me for video visit today. He did not check in. Called his phone twice, which automatically connects to voice message. Left voice message to contact the office.

## 2019-09-17 DIAGNOSIS — G4730 Sleep apnea, unspecified: Secondary | ICD-10-CM

## 2019-09-17 DIAGNOSIS — F329 Major depressive disorder, single episode, unspecified: Secondary | ICD-10-CM

## 2019-09-17 DIAGNOSIS — K648 Other hemorrhoids: Secondary | ICD-10-CM

## 2019-09-17 DIAGNOSIS — M545 Low back pain, unspecified: Secondary | ICD-10-CM

## 2019-09-17 DIAGNOSIS — J449 Chronic obstructive pulmonary disease, unspecified: Secondary | ICD-10-CM

## 2019-09-17 DIAGNOSIS — K2100 Gastro-esophageal reflux disease with esophagitis, without bleeding: Secondary | ICD-10-CM

## 2020-09-11 ENCOUNTER — Encounter: Payer: Self-pay | Admitting: Neurology

## 2020-09-18 NOTE — Progress Notes (Deleted)
Assessment/Plan:   ***  Subjective:   Chad Glass was seen today in the movement disorders clinic for neurologic consultation at the request of Carmel Sacramento, NP.  The consultation is for the evaluation of tremor.  Outside records that were made available to me were reviewed.  Pt is a 49 y.o. with a hx of chronic pain on opioids who presents for the evaluation of tremor.  Tremor: {yes no:314532}   How long has it been going on? ***  At rest or with activation?  ***  When is it noted the most?  ***  Fam hx of tremor?  {yes [id number]}  Located where?  ***  Affected by caffeine:  {yes no:314532}  Affected by alcohol:  {yes no:314532}  Affected by stress:  {yes no:314532}  Affected by fatigue:  {yes no:314532}  Spills soup if on spoon:  {yes no:314532}  Spills glass of liquid if full:  {yes no:314532}  Affects ADL's (tying shoes, brushing teeth, etc):  {yes no:314532}  Tremor inducing meds:  {yes no:314532}  Other Specific Symptoms:  Voice: *** Sleep: ***  Vivid Dreams:  {yes no:314532}  Acting out dreams:  {yes no:314532} Wet Pillows: {yes no:314532} Postural symptoms:  {yes no:314532}  Falls?  {yes no:314532} Bradykinesia symptoms: {parkinson brady:18041} Loss of smell:  {yes no:314532} Loss of taste:  {yes no:314532} Urinary Incontinence:  {yes no:314532} Difficulty Swallowing:  {yes no:314532} Handwriting, micrographia: {yes no:314532} Trouble with ADL's:  {yes no:314532}  Trouble buttoning clothing: {yes no:314532} Depression:  {yes no:314532} Memory changes:  {yes no:314532} Hallucinations:  {yes no:314532}  visual distortions: {yes no:314532} N/V:  {yes no:314532} Lightheaded:  {yes no:314532}  Syncope: {yes no:314532} Diplopia:  {yes no:314532} Dyskinesia:  {yes no:314532}  Neuroimaging of the brain has *** previously been performed.  It *** available for my review today.  PREVIOUS MEDICATIONS: {Parkinson's RX:18200}  ALLERGIES:  No Known  Allergies  CURRENT MEDICATIONS:  Current Outpatient Medications  Medication Instructions  . albuterol (VENTOLIN HFA) 108 (90 Base) MCG/ACT inhaler 2 puffs, Inhalation, 4 times daily PRN  . ALPRAZolam (XANAX) 1 mg, 4 times daily PRN  . DULoxetine (CYMBALTA) 90 mg, Oral, Daily  . hydrocortisone (PROCTO-MED HC) 2.5 % rectal cream 1 application, Rectal, 4 times daily  . ibuprofen (ADVIL) 200 mg, Every 6 hours PRN  . loratadine (CLARITIN) 10 mg, Oral, Daily  . oxyCODONE (ROXICODONE) 15 mg, 5 times daily  . pantoprazole (PROTONIX) 40 mg, Oral, Daily    Objective:   PHYSICAL EXAMINATION:    VITALS:  There were no vitals filed for this visit.  GEN:  The patient appears stated age and is in NAD. HEENT:  Normocephalic, atraumatic.  The mucous membranes are moist. The superficial temporal arteries are without ropiness or tenderness. CV:  RRR Lungs:  CTAB Neck/HEME:  There are no carotid bruits bilaterally.  Neurological examination:  Orientation: The patient is alert and oriented x3.  Cranial nerves: There is good facial symmetry.  Extraocular muscles are intact. The visual fields are full to confrontational testing. The speech is fluent and clear. Soft palate rises symmetrically and there is no tongue deviation. Hearing is intact to conversational tone. Sensation: Sensation is intact to light touch throughout (facial, trunk, extremities). Vibration is intact at the bilateral big toe. There is no extinction with double simultaneous stimulation.  Motor: Strength is 5/5 in the bilateral upper and lower extremities.   Shoulder shrug is equal and symmetric.  There is no pronator drift. Deep tendon reflexes: Deep tendon  reflexes are 2/4 at the bilateral biceps, triceps, brachioradialis, patella and achilles. Plantar responses are downgoing bilaterally.  Movement examination: Tone: There is ***tone in the bilateral upper extremities.  The tone in the lower extremities is ***.  Abnormal movements:  *** Coordination:  There is *** decremation with RAM's, *** Gait and Station: The patient has *** difficulty arising out of a deep-seated chair without the use of the hands. The patient's stride length is ***.  The patient has a *** pull test.     I have reviewed and interpreted the following labs independently   Chemistry      Component Value Date/Time   NA 145 02/07/2016 0000   K 4.2 02/07/2016 0000   CL 111 (A) 02/07/2016 0000   CO2 24 (A) 02/07/2016 0000   BUN 10 02/07/2016 0000   CREATININE 1.1 02/07/2016 0000   CREATININE 1.03 11/23/2011 1500   GLU 130 02/07/2016 0000      Component Value Date/Time   CALCIUM 9.3 11/23/2011 1500   ALKPHOS 76 02/07/2016 0000   AST 20 02/07/2016 0000   ALT 21 02/07/2016 0000      No results found for: TSH Lab Results  Component Value Date   WBC 9.0 02/07/2016   HGB 16.2 02/07/2016   HCT 47 02/07/2016   MCV 93.0 11/23/2011   PLT 223 02/07/2016      Total time spent on today's visit was ***greater than 60 minutes, including both face-to-face time and nonface-to-face time.  Time included that spent on review of records (prior notes available to me/labs/imaging if pertinent), discussing treatment and goals, answering patient's questions and coordinating care.  Cc:  Kari Baars, MD

## 2020-09-19 ENCOUNTER — Ambulatory Visit: Payer: Medicaid Other | Admitting: Neurology

## 2020-09-19 NOTE — Progress Notes (Deleted)
 Assessment/Plan:   ***  Subjective:   Chad Glass was seen today in the movement disorders clinic for neurologic consultation at the request of Terry, Tyler, NP.  The consultation is for the evaluation of tremor.  Outside records that were made available to me were reviewed.  Pt is a 49 y.o. with a hx of chronic pain on opioids who presents for the evaluation of tremor.  Tremor: {yes no:314532}   How long has it been going on? ***  At rest or with activation?  ***  When is it noted the most?  ***  Fam hx of tremor?  {yes no:314532}  Located where?  ***  Affected by caffeine:  {yes no:314532}  Affected by alcohol:  {yes no:314532}  Affected by stress:  {yes no:314532}  Affected by fatigue:  {yes no:314532}  Spills soup if on spoon:  {yes no:314532}  Spills glass of liquid if full:  {yes no:314532}  Affects ADL's (tying shoes, brushing teeth, etc):  {yes no:314532}  Tremor inducing meds:  {yes no:314532}  Other Specific Symptoms:  Voice: *** Sleep: ***  Vivid Dreams:  {yes no:314532}  Acting out dreams:  {yes no:314532} Wet Pillows: {yes no:314532} Postural symptoms:  {yes no:314532}  Falls?  {yes no:314532} Bradykinesia symptoms: {parkinson brady:18041} Loss of smell:  {yes no:314532} Loss of taste:  {yes no:314532} Urinary Incontinence:  {yes no:314532} Difficulty Swallowing:  {yes no:314532} Handwriting, micrographia: {yes no:314532} Trouble with ADL's:  {yes no:314532}  Trouble buttoning clothing: {yes no:314532} Depression:  {yes no:314532} Memory changes:  {yes no:314532} Hallucinations:  {yes no:314532}  visual distortions: {yes no:314532} N/V:  {yes no:314532} Lightheaded:  {yes no:314532}  Syncope: {yes no:314532} Diplopia:  {yes no:314532} Dyskinesia:  {yes no:314532}  Neuroimaging of the brain has *** previously been performed.  It *** available for my review today.  PREVIOUS MEDICATIONS: {Parkinson's RX:18200}  ALLERGIES:  No Known  Allergies  CURRENT MEDICATIONS:  Current Outpatient Medications  Medication Instructions  . albuterol (VENTOLIN HFA) 108 (90 Base) MCG/ACT inhaler 2 puffs, Inhalation, 4 times daily PRN  . ALPRAZolam (XANAX) 1 mg, 4 times daily PRN  . DULoxetine (CYMBALTA) 90 mg, Oral, Daily  . hydrocortisone (PROCTO-MED HC) 2.5 % rectal cream 1 application, Rectal, 4 times daily  . ibuprofen (ADVIL) 200 mg, Every 6 hours PRN  . loratadine (CLARITIN) 10 mg, Oral, Daily  . oxyCODONE (ROXICODONE) 15 mg, 5 times daily  . pantoprazole (PROTONIX) 40 mg, Oral, Daily    Objective:   PHYSICAL EXAMINATION:    VITALS:  There were no vitals filed for this visit.  GEN:  The patient appears stated age and is in NAD. HEENT:  Normocephalic, atraumatic.  The mucous membranes are moist. The superficial temporal arteries are without ropiness or tenderness. CV:  RRR Lungs:  CTAB Neck/HEME:  There are no carotid bruits bilaterally.  Neurological examination:  Orientation: The patient is alert and oriented x3.  Cranial nerves: There is good facial symmetry.  Extraocular muscles are intact. The visual fields are full to confrontational testing. The speech is fluent and clear. Soft palate rises symmetrically and there is no tongue deviation. Hearing is intact to conversational tone. Sensation: Sensation is intact to light touch throughout (facial, trunk, extremities). Vibration is intact at the bilateral big toe. There is no extinction with double simultaneous stimulation.  Motor: Strength is 5/5 in the bilateral upper and lower extremities.   Shoulder shrug is equal and symmetric.  There is no pronator drift. Deep tendon reflexes: Deep tendon   reflexes are 2/4 at the bilateral biceps, triceps, brachioradialis, patella and achilles. Plantar responses are downgoing bilaterally.  Movement examination: Tone: There is ***tone in the bilateral upper extremities.  The tone in the lower extremities is ***.  Abnormal movements:  *** Coordination:  There is *** decremation with RAM's, *** Gait and Station: The patient has *** difficulty arising out of a deep-seated chair without the use of the hands. The patient's stride length is ***.  The patient has a *** pull test.     I have reviewed and interpreted the following labs independently   Chemistry      Component Value Date/Time   NA 145 02/07/2016 0000   K 4.2 02/07/2016 0000   CL 111 (A) 02/07/2016 0000   CO2 24 (A) 02/07/2016 0000   BUN 10 02/07/2016 0000   CREATININE 1.1 02/07/2016 0000   CREATININE 1.03 11/23/2011 1500   GLU 130 02/07/2016 0000      Component Value Date/Time   CALCIUM 9.3 11/23/2011 1500   ALKPHOS 76 02/07/2016 0000   AST 20 02/07/2016 0000   ALT 21 02/07/2016 0000      No results found for: TSH Lab Results  Component Value Date   WBC 9.0 02/07/2016   HGB 16.2 02/07/2016   HCT 47 02/07/2016   MCV 93.0 11/23/2011   PLT 223 02/07/2016      Total time spent on today's visit was ***greater than 60 minutes, including both face-to-face time and nonface-to-face time.  Time included that spent on review of records (prior notes available to me/labs/imaging if pertinent), discussing treatment and goals, answering patient's questions and coordinating care.  Cc:  Kari Baars, MD

## 2022-01-06 ENCOUNTER — Encounter: Payer: Self-pay | Admitting: Neurology

## 2022-01-12 NOTE — Progress Notes (Deleted)
Assessment/Plan:   ***  Subjective:   Chad Glass was seen today in the movement disorders clinic for neurologic consultation at the request of Carmel [place], NP.  This is a 51 year old male with a history of chronic pain on opioids who presents for the evaluation of tremor.  Patient currently being treated with Propranolol ER, 120 mg daily.  Otherwise, no records available about tremor for my review.  Tremor: {yes no:314532}   How long has it been going on? ***  At rest or with activation?  ***  When is it noted the most?  ***  Fam hx of tremor?  {yes AL:[id number]}  Located where?  ***  Affected by caffeine:  {yes no:314532}  Affected by alcohol:  {yes no:314532}  Affected by stress:  {yes no:314532}  Affected by fatigue:  {yes no:314532}  Spills soup if on spoon:  {yes no:314532}  Spills glass of liquid if full:  {yes no:314532}  Affects ADL's (tying shoes, brushing teeth, etc):  {yes no:314532}  Tremor inducing meds:  {yes no:314532} albuterol  Other Specific Symptoms:  Voice: *** Sleep: ***  Vivid Dreams:  {yes no:314532}  Acting out dreams:  {yes no:314532} Wet Pillows: {yes no:314532} Postural symptoms:  {yes no:314532}  Falls?  {yes no:314532} Bradykinesia symptoms: {parkinson brady:18041} Loss of smell:  {yes no:314532} Loss of taste:  {yes no:314532} Urinary Incontinence:  {yes no:314532} Difficulty Swallowing:  {yes no:314532} Handwriting, micrographia: {yes no:314532} Trouble with ADL's:  {yes no:314532}  Trouble buttoning clothing: {yes no:314532} Depression:  {yes no:314532} Memory changes:  {yes no:314532} Hallucinations:  {yes no:314532}  visual distortions: {yes no:314532} N/V:  {yes no:314532} Lightheaded:  {yes no:314532}  Syncope: {yes no:314532} Diplopia:  {yes no:314532} Dyskinesia:  {yes no:314532}  Neuroimaging of the brain has *** previously been performed.  It *** available for my review today.  PREVIOUS MEDICATIONS: {Parkinson's  RX:18200}  ALLERGIES:  No Known Allergies  CURRENT MEDICATIONS:  Current Outpatient Medications  Medication Instructions   albuterol (VENTOLIN HFA) 108 (90 Base) MCG/ACT inhaler 2 puffs, Inhalation, 4 times daily PRN   ALPRAZolam (XANAX) 1 mg, 4 times daily PRN   DULoxetine (CYMBALTA) 90 mg, Oral, Daily   hydrocortisone (PROCTO-MED HC) 2.5 % rectal cream 1 application., Rectal, 4 times daily   ibuprofen (ADVIL) 200 mg, Every 6 hours PRN   loratadine (CLARITIN) 10 mg, Oral, Daily   oxyCODONE (ROXICODONE) 15 mg, 5 times daily   pantoprazole (PROTONIX) 40 mg, Oral, Daily    Objective:   PHYSICAL EXAMINATION:    VITALS:  There were no vitals filed for this visit.  GEN:  The patient appears stated age and is in NAD. HEENT:  Normocephalic, atraumatic.  The mucous membranes are moist. The superficial temporal arteries are without ropiness or tenderness. CV:  RRR Lungs:  CTAB Neck/HEME:  There are no carotid bruits bilaterally.  Neurological examination:  Orientation: The patient is alert and oriented x3.  Cranial nerves: There is good facial symmetry.  Extraocular muscles are intact. The visual fields are full to confrontational testing. The speech is fluent and clear. Soft palate rises symmetrically and there is no tongue deviation. Hearing is intact to conversational tone. Sensation: Sensation is intact to light touch throughout (facial, trunk, extremities). Vibration is intact at the bilateral big toe. There is no extinction with double simultaneous stimulation.  Motor: Strength is 5/5 in the bilateral upper and lower extremities.   Shoulder shrug is equal and symmetric.  There is no pronator drift. Deep tendon reflexes:  Deep tendon reflexes are 2/4 at the bilateral biceps, triceps, brachioradialis, patella and achilles. Plantar responses are downgoing bilaterally.  Movement examination: Tone: There is ***tone in the bilateral upper extremities.  The tone in the lower extremities is  ***.  Abnormal movements: *** Coordination:  There is *** decremation with RAM's, *** Gait and Station: The patient has *** difficulty arising out of a deep-seated chair without the use of the hands. The patient's stride length is ***.  The patient has a *** pull test.     I have reviewed and interpreted the following labs independently   Chemistry      Component Value Date/Time   NA 145 02/07/2016 0000   K 4.2 02/07/2016 0000   CL 111 (A) 02/07/2016 0000   CO2 24 (A) 02/07/2016 0000   BUN 10 02/07/2016 0000   CREATININE 1.1 02/07/2016 0000   CREATININE 1.03 11/23/2011 1500   GLU 130 02/07/2016 0000      Component Value Date/Time   CALCIUM 9.3 11/23/2011 1500   ALKPHOS 76 02/07/2016 0000   AST 20 02/07/2016 0000   ALT 21 02/07/2016 0000      No results found for: TSH Lab Results  Component Value Date   WBC 9.0 02/07/2016   HGB 16.2 02/07/2016   HCT 47 02/07/2016   MCV 93.0 11/23/2011   PLT 223 02/07/2016      Total time spent on today's visit was ***greater than 60 minutes, including both face-to-face time and nonface-to-face time.  Time included that spent on review of records (prior notes available to me/labs/imaging if pertinent), discussing treatment and goals, answering patient's questions and coordinating care.  Cc:  Kari Baars, MD

## 2022-01-13 ENCOUNTER — Ambulatory Visit: Payer: Medicaid Other | Admitting: Neurology

## 2022-01-13 ENCOUNTER — Encounter: Payer: Self-pay | Admitting: Neurology

## 2022-01-13 DIAGNOSIS — Z029 Encounter for administrative examinations, unspecified: Secondary | ICD-10-CM

## 2022-02-16 ENCOUNTER — Emergency Department (HOSPITAL_COMMUNITY)
Admission: EM | Admit: 2022-02-16 | Discharge: 2022-02-16 | Disposition: A | Discharge status: Home / Self Care | Payer: Medicaid Other | Attending: Emergency Medicine | Admitting: Emergency Medicine

## 2022-02-16 ENCOUNTER — Emergency Department (HOSPITAL_COMMUNITY): Payer: Medicaid Other

## 2022-02-16 DIAGNOSIS — Z23 Encounter for immunization: Secondary | ICD-10-CM | POA: Insufficient documentation

## 2022-02-16 DIAGNOSIS — S51811A Laceration without foreign body of right forearm, initial encounter: Secondary | ICD-10-CM | POA: Insufficient documentation

## 2022-02-16 DIAGNOSIS — F10920 Alcohol use, unspecified with intoxication, uncomplicated: Secondary | ICD-10-CM

## 2022-02-16 DIAGNOSIS — W25XXXA Contact with sharp glass, initial encounter: Secondary | ICD-10-CM | POA: Diagnosis not present

## 2022-02-16 DIAGNOSIS — S61212A Laceration without foreign body of right middle finger without damage to nail, initial encounter: Secondary | ICD-10-CM | POA: Insufficient documentation

## 2022-02-16 DIAGNOSIS — Y92009 Unspecified place in unspecified non-institutional (private) residence as the place of occurrence of the external cause: Secondary | ICD-10-CM | POA: Diagnosis not present

## 2022-02-16 DIAGNOSIS — T07XXXA Unspecified multiple injuries, initial encounter: Secondary | ICD-10-CM

## 2022-02-16 DIAGNOSIS — S59911A Unspecified injury of right forearm, initial encounter: Secondary | ICD-10-CM | POA: Diagnosis present

## 2022-02-16 MED ORDER — LIDOCAINE-EPINEPHRINE (PF) 2 %-1:200000 IJ SOLN
20.0000 mL | Freq: Once | INTRAMUSCULAR | Status: AC
  Administered 2022-02-16: 20 mL
  Filled 2022-02-16: qty 20

## 2022-02-16 MED ORDER — HYDROCODONE-ACETAMINOPHEN 5-325 MG PO TABS
1.0000 | ORAL_TABLET | Freq: Once | ORAL | Status: AC
  Administered 2022-02-16: 1 via ORAL
  Filled 2022-02-16: qty 1

## 2022-02-16 MED ORDER — TETANUS-DIPHTH-ACELL PERTUSSIS 5-2.5-18.5 LF-MCG/0.5 IM SUSY
0.5000 mL | PREFILLED_SYRINGE | Freq: Once | INTRAMUSCULAR | Status: AC
  Administered 2022-02-16: 0.5 mL via INTRAMUSCULAR
  Filled 2022-02-16: qty 0.5

## 2022-02-16 NOTE — Discharge Instructions (Addendum)
Follow-up with your doctor or return to this emergency department in 10 days to have the stitches removed.

## 2022-02-16 NOTE — ED Notes (Signed)
Attempted to change pt out into clean clothes. Pt refused. Pt's socks changed and blood cleaned off arms. Discharge instructions gone over with pt. Pt verbalized understanding and elected to leave

## 2022-02-16 NOTE — ED Provider Notes (Signed)
[facility] EMERGENCY DEPARTMENT Provider Note   CSN: [id number] Arrival date & time: 02/16/22  0106     History  Chief Complaint  Patient presents with   Laceration   Alcohol Intoxication    Chad Glass is a 51 y.o. male.  Patient presents to the emergency department from home by ambulance.  Patient with multiple lacerations on both upper extremities.  Patient accidentally put his arms through a plate glass window.  Patient is intoxicated.  He was initially agitated but was verbally redirected by EMS.  He is pleasant and cooperative at arrival.  Patient complaining of pain of the right arm.      Home Medications Prior to Admission medications   Medication Sig Start Date End Date Taking? Authorizing Provider  albuterol (VENTOLIN HFA) 108 (90 Base) MCG/ACT inhaler Inhale 2 puffs into the lungs 4 (four) times daily as needed for wheezing or shortness of breath.    [provider]  ALPRAZolam Prudy Feeler) 1 MG tablet Take 1 mg by mouth 4 (four) times daily as needed. For anxiety    [provider]  DULoxetine (CYMBALTA) 30 MG capsule Take 90 mg by mouth daily.    [provider]  hydrocortisone (PROCTO-MED HC) 2.5 % rectal cream Place 1 application rectally 4 (four) times daily.    [provider]  ibuprofen (ADVIL,MOTRIN) 100 MG tablet Take 200 mg by mouth every 6 (six) hours as needed.    [provider]  loratadine (CLARITIN) 10 MG tablet Take 10 mg by mouth daily.    [provider]  oxyCODONE (ROXICODONE) 15 MG immediate release tablet Take 15 mg by mouth 5 (five) times daily.    [provider]  pantoprazole (PROTONIX) 40 MG tablet Take 40 mg by mouth daily.    [provider]      Allergies    Patient has no known allergies.    Review of Systems   Review of Systems  Physical Exam Updated Vital Signs BP (!) 114/56   Pulse 64   Temp 98.1 F (36.7 C) (Oral)   Resp 18   Ht  (1.753 m)   Wt  120.2 kg   SpO2 94%   BMI 39.13 kg/m  Physical Exam Vitals and nursing note reviewed.  Constitutional:      General: He is not in acute distress.    Appearance: He is well-developed.  HENT:     Head: Normocephalic and atraumatic.     Mouth/Throat:     Mouth: Mucous membranes are moist.  Eyes:     General: Vision grossly intact. Gaze aligned appropriately.     Extraocular Movements: Extraocular movements intact.     Conjunctiva/sclera: Conjunctivae normal.  Cardiovascular:     Rate and Rhythm: Normal rate and regular rhythm.     Pulses: Normal pulses.     Heart sounds: Normal heart sounds, S1 normal and S2 normal. No murmur heard.   No friction rub. No gallop.  Pulmonary:     Effort: Pulmonary effort is normal. No respiratory distress.     Breath sounds: Normal breath sounds.  Abdominal:     Palpations: Abdomen is soft.     Tenderness: There is no abdominal tenderness. There is no guarding or rebound.     Hernia: No hernia is present.  Musculoskeletal:        General: No swelling.     Cervical back: Full passive range of motion without pain, normal range of motion and neck  supple. No pain with movement, spinous process tenderness or muscular tenderness. Normal range of motion.     Right lower leg: No edema.     Left lower leg: No edema.     Comments: Normal sensation across III nerve distribution in right hand.  Normal abduction, abduction, opposition of fingers.  No motor function abnormality noted.  Skin:    General: Skin is warm and dry.     Capillary Refill: Capillary refill takes less than 2 seconds.     Findings: Laceration (1.5 cm over dorsal aspect of right middle finger, DIP joint region; 5 cm ulna-lateral mid forearm; 4 cm dorsal-ulnar mid forearm; numerous other superficial and small lacerations on bilateral hands) present. No ecchymosis, erythema, lesion or wound.  Neurological:     Mental Status: He is alert and oriented to person, place, and time.     GCS: GCS  eye subscore is 4. GCS verbal subscore is 5. GCS motor subscore is 6.     Cranial Nerves: Cranial nerves 2-12 are intact.     Sensory: Sensation is intact.     Motor: Motor function is intact. No weakness or abnormal muscle tone.     Coordination: Coordination is intact.  Psychiatric:        Mood and Affect: Mood normal.        Speech: Speech normal.        Behavior: Behavior normal.    ED Results / Procedures / Treatments   Labs (all labs ordered are listed, but only abnormal results are displayed) Labs Reviewed - No data to display  EKG None  Radiology DG Forearm Right  Result Date: 02/16/2022 CLINICAL DATA:  Forearm laceration EXAM: RIGHT FOREARM - 2 VIEW COMPARISON:  None Available. FINDINGS: No fracture or dislocation is seen. The joint spaces are preserved. Soft tissue laceration along the medial aspect of the distal ulna. No radiopaque foreign body is seen. IMPRESSION: Soft tissue laceration along the medial aspect of the distal ulna. No fracture, dislocation, or radiopaque foreign body is seen. Electronically Signed   By: Charline Bills M.D.   On: 02/16/2022 01:39   DG Hand Complete Right  Result Date: 02/16/2022 CLINICAL DATA:  Right 3rd finger laceration EXAM: RIGHT HAND - COMPLETE 3+ VIEW COMPARISON:  None Available. FINDINGS: No fracture or dislocation is seen. The joint spaces are preserved. Soft tissue laceration along the distal 3rd digit at the DIP joint. No radiopaque foreign body is seen. IMPRESSION: Soft tissue laceration along the distal 3rd digit at the DIP joint. No fracture, dislocation, or radiopaque foreign body is seen. Electronically Signed   By: Charline Bills M.D.   On: 02/16/2022 01:38    Procedures .Marland KitchenLaceration Repair  Date/Time: 02/16/2022 3:03 AM Performed by: Gilda Crease, MD Authorized by: Gilda Crease, MD   Consent:    Consent obtained:  Verbal   Consent given by:  Patient   Risks, benefits, and alternatives were  discussed: yes     Risks discussed:  Infection, pain, retained foreign body and poor cosmetic result Universal protocol:    Procedure explained and questions answered to patient or proxy's satisfaction: yes     Relevant documents present and verified: yes     Test results available: yes     Imaging studies available: yes     Required blood products, implants, devices, and special equipment available: yes     Site/side marked: yes     Immediately prior to procedure, a time out was called:  yes     Patient identity confirmed:  Verbally with patient Anesthesia:    Anesthesia method:  Local infiltration   Local anesthetic:  Lidocaine 2% WITH epi Laceration details:    Location:  Shoulder/arm   Shoulder/arm location:  R lower arm   Length (cm):  5 Pre-procedure details:    Preparation:  Patient was prepped and draped in usual sterile fashion and imaging obtained to evaluate for foreign bodies Exploration:    Wound exploration: wound explored through full range of motion and entire depth of wound visualized     Wound extent: no nerve damage noted, no tendon damage noted and no vascular damage noted     Contaminated: no   Treatment:    Area cleansed with:  Chlorhexidine   Irrigation solution:  Sterile saline   Irrigation volume:  250   Irrigation method:  Syringe   Debridement:  None Skin repair:    Repair method:  Sutures   Suture size:  3-0   Suture material:  Prolene   Suture technique:  Simple interrupted   Number of sutures:  5 Approximation:    Approximation:  Close Repair type:    Repair type:  Simple Post-procedure details:    Dressing:  Open (no dressing)   Procedure completion:  Tolerated well, no immediate complications   .Marland KitchenLaceration Repair  Date/Time: 02/16/2022 3:03 AM Performed by: Gilda Crease, MD Authorized by: Gilda Crease, MD   Consent:    Consent obtained:  Verbal   Consent given by:  Patient   Risks, benefits, and alternatives were  discussed: yes     Risks discussed:  Infection, pain, retained foreign body and poor cosmetic result Universal protocol:    Procedure explained and questions answered to patient or proxy's satisfaction: yes     Relevant documents present and verified: yes     Test results available: yes     Imaging studies available: yes     Required blood products, implants, devices, and special equipment available: yes     Site/side marked: yes     Immediately prior to procedure, a time out was called: yes     Patient identity confirmed:  Verbally with patient Anesthesia:    Anesthesia method:  Local infiltration   Local anesthetic:  Lidocaine 2% WITH epi Laceration details:    Location:  Shoulder/arm   Shoulder/arm location:  R lower arm   Length (cm):  4 Pre-procedure details:    Preparation:  Patient was prepped and draped in usual sterile fashion and imaging obtained to evaluate for foreign bodies Exploration:    Wound exploration: wound explored through full range of motion and entire depth of wound visualized     Wound extent: no nerve damage noted, no tendon damage noted and no vascular damage noted     Contaminated: no   Treatment:    Area cleansed with:  Chlorhexidine   Irrigation solution:  Sterile saline   Irrigation volume:  250   Irrigation method:  Syringe   Debridement:  None Skin repair:    Repair method:  Sutures   Suture size:  3-0   Suture material:  Prolene   Suture technique:  Simple interrupted   Number of sutures:  3 Approximation:    Approximation:  Close Repair type:    Repair type:  Simple Post-procedure details:    Dressing:  Open (no dressing)   Procedure completion:  Tolerated well, no immediate complications   .Marland KitchenLaceration Repair  Date/Time: 02/16/2022 3:03  AM Performed by: Gilda CreasePollina, Essica Kiker J, MD Authorized by: Gilda CreasePollina, Wilder Kurowski J, MD   Consent:    Consent obtained:  Verbal   Consent given by:  Patient   Risks, benefits, and alternatives were  discussed: yes     Risks discussed:  Infection, pain, retained foreign body and poor cosmetic result Universal protocol:    Procedure explained and questions answered to patient or proxy's satisfaction: yes     Relevant documents present and verified: yes     Test results available: yes     Imaging studies available: yes     Required blood products, implants, devices, and special equipment available: yes     Site/side marked: yes     Immediately prior to procedure, a time out was called: yes     Patient identity confirmed:  Verbally with patient Anesthesia:    Anesthesia method:  Local infiltration   Local anesthetic:  Lidocaine 2% WITHOUT epi Laceration details:    Location:  Hand   Shoulder/arm location:  R middle finger   Length (cm):  1.5 Pre-procedure details:    Preparation:  Patient was prepped and draped in usual sterile fashion and imaging obtained to evaluate for foreign bodies Exploration:    Wound exploration: wound explored through full range of motion and entire depth of wound visualized     Wound extent: no nerve damage noted, no tendon damage noted and no vascular damage noted     Contaminated: no   Treatment:    Area cleansed with:  Chlorhexidine   Irrigation solution:  Sterile saline   Irrigation volume:  250   Irrigation method:  Syringe   Debridement:  None Skin repair:    Repair method:  Sutures   Suture size:  4-0   Suture material:  Prolene   Suture technique:  Simple interrupted   Number of sutures:  3 Approximation:    Approximation:  Close Repair type:    Repair type:  Simple Post-procedure details:    Dressing:  Open (no dressing)   Procedure completion:  Tolerated well, no immediate complications     Medications Ordered in ED Medications  lidocaine-EPINEPHrine (XYLOCAINE W/EPI) 2 %-1:200000 (PF) injection 20 mL (has no administration in time range)  Tdap (BOOSTRIX) injection 0.5 mL (0.5 mLs Intramuscular Given 02/16/22 0154)    ED Course/  Medical Decision Making/ A&P                           Medical Decision Making Amount and/or Complexity of Data Reviewed Radiology: ordered.  Risk Prescription drug management.   Patient presents to the emergency department for evaluation of lacerations after putting his arms through a glass window.  Patient has numerous superficial lacerations that do not need repair.  He had 3 more substantial lacerations that were repaired as outlined above.  All wounds were visualized through full range of motion, no tendon, vascular injury noted.  There was no bleeding during repair.  Extensive cleaning was performed, no glass or foreign body noted.  X-rays were negative as well.        Final Clinical Impression(s) / ED Diagnoses Final diagnoses:  Alcoholic intoxication without complication [facility](HCC)  Multiple lacerations    Rx / DC Orders ED Discharge Orders     None         Gilda CreasePollina, Cielle Aguila J, MD 02/16/22 [id number]

## 2022-02-16 NOTE — ED Triage Notes (Signed)
Pt came in via [facility] with c/o laceration t R arm and R 3rd finger. Pt is intoxicated and punched through a window. Bleeding controlled at this time.

## 2022-05-06 ENCOUNTER — Encounter: Payer: Self-pay | Admitting: Internal Medicine

## 2022-05-31 NOTE — Progress Notes (Unsigned)
GI Office Note    Referring Provider: Kari Baars, MD Primary Care Physician:  Carmel Sacramento, NP  Primary Gastroenterologist: Dr. Jena Gauss  Chief Complaint   Chief Complaint  Patient presents with   Colonoscopy    Screening     History of Present Illness   Chad Glass is a 51 y.o. male presenting today at the request of Kari Baars, MD for ***rectal bleeding.  Most recent lab work available from 2017.  A1c 5.8, hemoglobin 16.2, platelets 223. Normal LFTs, renal function and B12.  Umbilical hernia repair in 2013.  No prior EGD or colonoscopy on file.  Today: Reported some rectal bleeding a couple months ago, just one occurrence. Reports right much bleeding in the commode and on the toilet paper. Every once in a while he will have small amounts. Denies rectal pain. Does repot some straining to use the bathroom. Stools are sometimes hard and sometimes hard. Does have daily bowel movements. Does sometimes do heavy lifting. Has felt hemorrhoids before and uses hemorrhoid cream as needed. Denies abdominal pain, melena.  Does report some dysphagia. Feels like food and liquids gets stuck in the base of his neck and was previously on pantoprazole twice daily in the past and has not taken it in a few months. That is when the swallowing issue started back. Does have some occasional throat burning and early satiety. Denies epigastric pain, lack of appetite, weight loss. Reports fatigue. Takes BC powders every once in a while and has been taking them a little more frequently. Does have coughing episodes and some regurgitation of food a few times.   Reports history of many family members with different forms of cancer. Does have some occasional shortness of breath. Does have occasional chest pain with his shortness of breath (exertion).   Uses Cpap at night.    Current Outpatient Medications  Medication Sig Dispense Refill   albuterol (VENTOLIN HFA) 108 (90 Base) MCG/ACT inhaler  Inhale 2 puffs into the lungs 4 (four) times daily as needed for wheezing or shortness of breath.     DULoxetine (CYMBALTA) 30 MG capsule Take 90 mg by mouth daily.     hydrocortisone (PROCTO-MED HC) 2.5 % rectal cream Place 1 application rectally 4 (four) times daily.     loratadine (CLARITIN) 10 MG tablet Take 10 mg by mouth daily.     oxyCODONE (ROXICODONE) 15 MG immediate release tablet Take 15 mg by mouth 5 (five) times daily.     ALPRAZolam (XANAX) 1 MG tablet Take 1 mg by mouth 4 (four) times daily as needed. For anxiety (Patient not taking: Reported on 06/01/2022)     ibuprofen (ADVIL,MOTRIN) 100 MG tablet Take 200 mg by mouth every 6 (six) hours as needed. (Patient not taking: Reported on 06/01/2022)     pantoprazole (PROTONIX) 40 MG tablet Take 40 mg by mouth daily. (Patient not taking: Reported on 06/01/2022)     No current facility-administered medications for this visit.    Past Medical History:  Diagnosis Date   Anxiety    Chronic back pain    Chronic neck pain    Chronic obstructive pulmonary disease, unspecified (HCC)    Depression    Gastro-esophageal reflux disease with esophagitis    GERD (gastroesophageal reflux disease)    Low back pain    Major depressive disorder, single episode, unspecified    Other hemorrhoids    Sleep apnea    CiPaP at night   Sleep apnea, unspecified  Past Surgical History:  Procedure Laterality Date   KNEE SURGERY     right-cut around the cartilage. Dr. Romeo Apple at [facility]   UMBILICAL HERNIA REPAIR  11/30/2011   Procedure: HERNIA REPAIR UMBILICAL ADULT;  Surgeon: Fabio Bering, MD;  Location: [facility];  Service: General;  Laterality: N/A;    Family History  Problem Relation Age of Onset   Arthritis Other    Diabetes Other    Anesthesia problems Neg Hx    Hypotension Neg Hx    Pseudochol deficiency Neg Hx    Malignant hyperthermia Neg Hx     Allergies as of 06/01/2022   (No Known Allergies)    Social History    Socioeconomic History   Marital status: Married    Spouse name: Not on file   Number of children: Not on file   Years of education: 9   Highest education level: Not on file  Occupational History   Occupation: unemployed    Employer: UNEMPLOYED  Tobacco Use   Smoking status: Every Day    Packs/day: 0.75    Years: 30.00    Total pack years: 22.50    Types: Cigarettes   Smokeless tobacco: Not on file  Substance and Sexual Activity   Alcohol use: No   Drug use: No   Sexual activity: Not on file  Other Topics Concern   Not on file  Social History Narrative   Not on file   Social Determinants of Health   Financial Resource Strain: Not on file  Food Insecurity: Not on file  Transportation Needs: Not on file  Physical Activity: Not on file  Stress: Not on file  Social Connections: Not on file  Intimate Partner Violence: Not on file     Review of Systems   Gen: Denies any fever, chills, fatigue, weight loss, lack of appetite.  CV: Denies chest pain, heart palpitations, peripheral edema, syncope.  Resp: Denies shortness of breath at rest or with exertion. Denies wheezing or cough.  GI: see HPI GU : Denies urinary burning, urinary frequency, urinary hesitancy MS: Denies joint pain, muscle weakness, cramps, or limitation of movement.  Derm: Denies rash, itching, dry skin Psych: Denies depression, anxiety, memory loss, and confusion Heme: Denies bruising, bleeding, and enlarged lymph nodes.   Physical Exam   BP (!) 155/77 (BP Location: Right Arm, Patient Position: Sitting, Cuff Size: Large)   Pulse 85   Temp 97.9 F (36.6 C) (Temporal)   Ht 6' (1.829 m)   Wt 250 lb (113.4 kg)   SpO2 97%   BMI 33.91 kg/m   General:   Alert and oriented. Pleasant and cooperative. Well-nourished and well-developed.  Head:  Normocephalic and atraumatic. Eyes:  Without icterus, sclera clear and conjunctiva pink.  Ears:  Normal auditory acuity. Mouth:  No deformity or lesions, oral  mucosa pink.  Lungs:  Clear to auscultation bilaterally. No wheezes, rales, or rhonchi. No distress.  Heart:  S1, S2 present without murmurs appreciated.  Abdomen:  +BS, soft, non-tender and non-distended. No HSM noted. No guarding or rebound. No masses appreciated.  Rectal:  Deferred  Msk:  Symmetrical without gross deformities. Normal posture. Extremities:  Without edema. Neurologic:  Alert and  oriented x4;  grossly normal neurologically. Skin:  Intact without significant lesions or rashes. Psych:  Alert and cooperative. Normal mood and affect.   Assessment   Chad Glass is a 51 y.o. male with a history of anxiety, chronic back pain, COPD, depression, GERD,  OSA using CPAP*** presenting today with rectal bleeding and dysphagia.  Dysphagia/GERD:   Rectal bleeding:   Fatigue:  Constipation:    PLAN   *** Proceed with EGD/ED and colonoscopy with propofol by Dr. Jena Gauss in near future: the risks, benefits, and alternatives have been discussed with the patient in detail. The patient states understanding and desires to proceed. ASA 3 Begin Pantoprazole 40 mg twice daily.  CBC, BMP, TSH, IgA, Ttg Iga GERD lifestyle modifications.  Avoid NSAIDs Miralax every other day for intermittent constipation. Follow up 2 months post procedure.   Brooke Bonito, MSN, FNP-BC, AGACNP-BC [facility]

## 2022-06-01 ENCOUNTER — Encounter: Payer: Self-pay | Admitting: Gastroenterology

## 2022-06-01 ENCOUNTER — Ambulatory Visit (INDEPENDENT_AMBULATORY_CARE_PROVIDER_SITE_OTHER): Payer: Medicaid Other | Admitting: Gastroenterology

## 2022-06-01 VITALS — BP 155/77 | HR 85 | Temp 97.9°F | Ht 72.0 in | Wt 250.0 lb

## 2022-06-01 DIAGNOSIS — R1319 Other dysphagia: Secondary | ICD-10-CM

## 2022-06-01 DIAGNOSIS — K625 Hemorrhage of anus and rectum: Secondary | ICD-10-CM | POA: Diagnosis not present

## 2022-06-01 DIAGNOSIS — Z1211 Encounter for screening for malignant neoplasm of colon: Secondary | ICD-10-CM

## 2022-06-01 DIAGNOSIS — K2100 Gastro-esophageal reflux disease with esophagitis, without bleeding: Secondary | ICD-10-CM | POA: Diagnosis not present

## 2022-06-01 DIAGNOSIS — K5903 Drug induced constipation: Secondary | ICD-10-CM

## 2022-06-01 DIAGNOSIS — R5383 Other fatigue: Secondary | ICD-10-CM | POA: Diagnosis not present

## 2022-06-01 MED ORDER — PANTOPRAZOLE SODIUM 40 MG PO TBEC: 40.0000 mg | DELAYED_RELEASE_TABLET | Freq: Two times a day (BID) | ORAL | 1 refills | Status: DC

## 2022-06-01 NOTE — Patient Instructions (Signed)
We are scheduling you for an upper endoscopy with possible dilation and colonoscopy with Dr. Jena Gauss in the near future.  For your swallowing and your reflux I have sent in pantoprazole 40 mg for you to take twice daily, 30 minutes prior to breakfast and dinner.  Follow a GERD diet/lifestyle:  Avoid fried, fatty, greasy, spicy, citrus foods. Avoid caffeine and carbonated beverages. Avoid chocolate. Try eating 4-6 small meals a day rather than 3 large meals. Do not eat within 3 hours of laying down. Prop head of bed up on wood or bricks to create a 6 inch incline.  I also want you to avoid taking any NSAIDs such as ibuprofen, Advil, Aleve, BC or Goody powders, or any package that states NSAID or nonsteroid on the package.   For your intermittent constipation also when she did take MiraLAX 17g or capful 2-3 times per week.  I suspect that some of her intermittent rectal bleeding is related to your hemorrhoids.  Continue to use your over-the-counter hemorrhoid cream as needed.  Also I am ordering labs for you have completed to check for anemia given your fatigue and rectal bleeding.

## 2022-06-04 IMAGING — DX DG FOREARM 2V*R*
2 series · 2 of 2 positions shown · non-contrast
Comparison: None Available.

CLINICAL DATA: Forearm laceration

EXAM:
RIGHT FOREARM - 2 VIEW

[forearm ap]
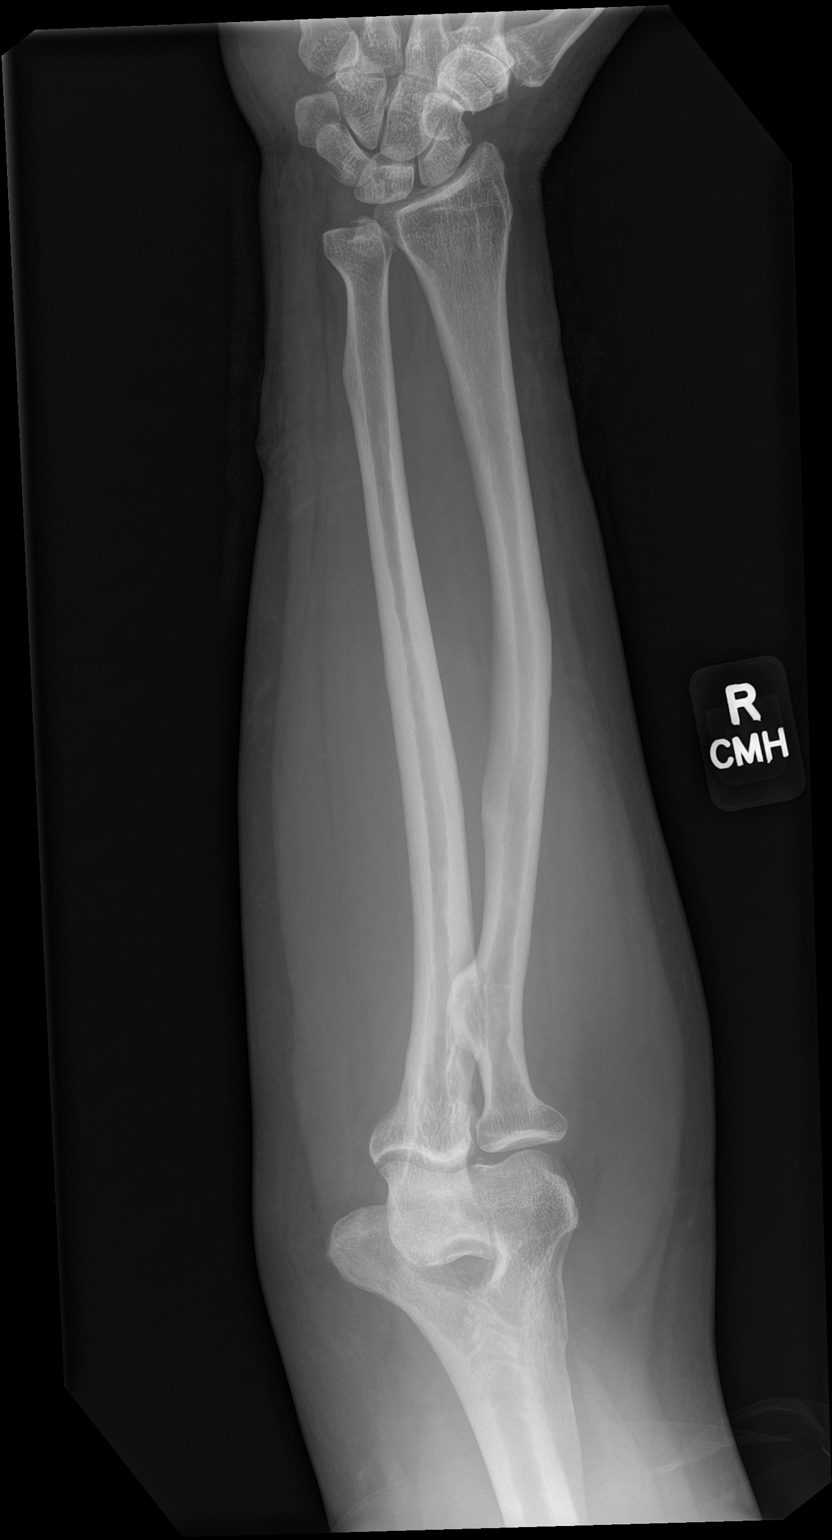

[forearm lat]
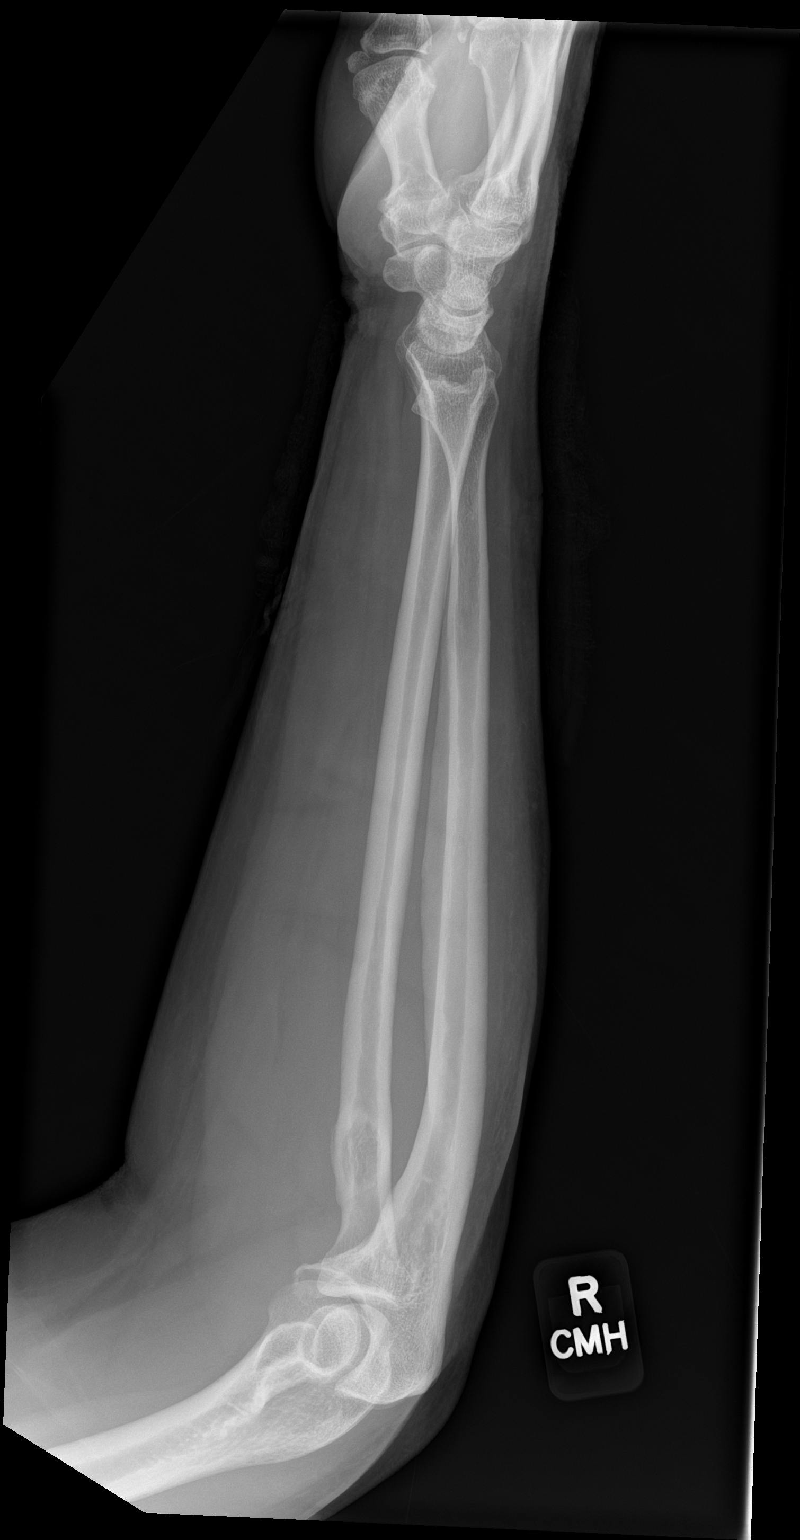

[2 of 2 positions shown; findings below may reference images not displayed]

FINDINGS: No fracture or dislocation is seen.

The joint spaces are preserved.

Soft tissue laceration along the medial aspect of the distal ulna.

No radiopaque foreign body is seen.
IMPRESSION: Soft tissue laceration along the medial aspect of the distal ulna.

No fracture, dislocation, or radiopaque foreign body is seen.

## 2022-06-22 ENCOUNTER — Encounter: Payer: Self-pay | Admitting: *Deleted

## 2022-06-22 ENCOUNTER — Telehealth: Payer: Self-pay | Admitting: *Deleted

## 2022-06-22 MED ORDER — CLENPIQ 10-3.5-12 MG-GM -GM/175ML PO SOLN: 1.0000 | ORAL | 0 refills | Status: DC

## 2022-06-22 NOTE — Telephone Encounter (Signed)
Spoke with pt. Scheduled for TCS/EGD, +/-ED ASA 3 with Dr. Gala Romney on 11/1 at 10:30am. Aware will mail prep instructions/pre-op appt. Rx for prep sent to pharmacy

## 2022-07-20 ENCOUNTER — Encounter (HOSPITAL_COMMUNITY)
Admission: RE | Admit: 2022-07-20 | Discharge: 2022-07-20 | Disposition: A | Discharge status: Home / Self Care | Payer: Medicaid Other | Source: Ambulatory Visit | Attending: Internal Medicine | Admitting: Internal Medicine

## 2022-07-21 ENCOUNTER — Encounter (HOSPITAL_COMMUNITY): Payer: Self-pay | Admitting: Anesthesiology

## 2022-07-22 ENCOUNTER — Encounter (HOSPITAL_COMMUNITY): Admission: RE | Payer: Self-pay | Source: Ambulatory Visit

## 2022-07-22 ENCOUNTER — Ambulatory Visit (HOSPITAL_COMMUNITY): Admission: RE | Admit: 2022-07-22 | Payer: Medicaid Other | Source: Ambulatory Visit | Admitting: Internal Medicine

## 2022-07-22 DIAGNOSIS — K648 Other hemorrhoids: Secondary | ICD-10-CM

## 2022-07-22 DIAGNOSIS — K2100 Gastro-esophageal reflux disease with esophagitis, without bleeding: Secondary | ICD-10-CM

## 2022-07-22 DIAGNOSIS — K625 Hemorrhage of anus and rectum: Secondary | ICD-10-CM

## 2022-07-22 SURGERY — COLONOSCOPY WITH PROPOFOL
Anesthesia: Monitor Anesthesia Care

## 2023-11-13 ENCOUNTER — Inpatient Hospital Stay (HOSPITAL_COMMUNITY): Payer: Medicaid Other

## 2023-11-13 ENCOUNTER — Inpatient Hospital Stay (HOSPITAL_COMMUNITY)
Admission: EM | Admit: 2023-11-13 | Discharge: 2023-11-20 | DRG: 871 | Disposition: E | Payer: Medicaid Other | Attending: Internal Medicine | Admitting: Internal Medicine

## 2023-11-13 ENCOUNTER — Other Ambulatory Visit: Payer: Self-pay

## 2023-11-13 ENCOUNTER — Encounter (HOSPITAL_COMMUNITY): Payer: Self-pay

## 2023-11-13 ENCOUNTER — Emergency Department (HOSPITAL_COMMUNITY): Payer: Medicaid Other

## 2023-11-13 DIAGNOSIS — F419 Anxiety disorder, unspecified: Secondary | ICD-10-CM | POA: Diagnosis present

## 2023-11-13 DIAGNOSIS — K922 Gastrointestinal hemorrhage, unspecified: Secondary | ICD-10-CM | POA: Diagnosis not present

## 2023-11-13 DIAGNOSIS — D696 Thrombocytopenia, unspecified: Secondary | ICD-10-CM | POA: Diagnosis present

## 2023-11-13 DIAGNOSIS — J9811 Atelectasis: Secondary | ICD-10-CM | POA: Diagnosis present

## 2023-11-13 DIAGNOSIS — R188 Other ascites: Secondary | ICD-10-CM | POA: Diagnosis present

## 2023-11-13 DIAGNOSIS — R451 Restlessness and agitation: Secondary | ICD-10-CM | POA: Diagnosis present

## 2023-11-13 DIAGNOSIS — Z8261 Family history of arthritis: Secondary | ICD-10-CM

## 2023-11-13 DIAGNOSIS — K766 Portal hypertension: Secondary | ICD-10-CM | POA: Diagnosis present

## 2023-11-13 DIAGNOSIS — Z1152 Encounter for screening for COVID-19: Secondary | ICD-10-CM | POA: Diagnosis not present

## 2023-11-13 DIAGNOSIS — G4733 Obstructive sleep apnea (adult) (pediatric): Secondary | ICD-10-CM | POA: Diagnosis not present

## 2023-11-13 DIAGNOSIS — R600 Localized edema: Secondary | ICD-10-CM

## 2023-11-13 DIAGNOSIS — A419 Sepsis, unspecified organism: Secondary | ICD-10-CM

## 2023-11-13 DIAGNOSIS — F32A Depression, unspecified: Secondary | ICD-10-CM | POA: Diagnosis present

## 2023-11-13 DIAGNOSIS — K920 Hematemesis: Secondary | ICD-10-CM | POA: Diagnosis not present

## 2023-11-13 DIAGNOSIS — I8511 Secondary esophageal varices with bleeding: Secondary | ICD-10-CM | POA: Diagnosis present

## 2023-11-13 DIAGNOSIS — J9601 Acute respiratory failure with hypoxia: Secondary | ICD-10-CM

## 2023-11-13 DIAGNOSIS — A4102 Sepsis due to Methicillin resistant Staphylococcus aureus: Principal | ICD-10-CM | POA: Diagnosis present

## 2023-11-13 DIAGNOSIS — K2100 Gastro-esophageal reflux disease with esophagitis, without bleeding: Secondary | ICD-10-CM | POA: Diagnosis present

## 2023-11-13 DIAGNOSIS — D649 Anemia, unspecified: Secondary | ICD-10-CM | POA: Diagnosis not present

## 2023-11-13 DIAGNOSIS — K219 Gastro-esophageal reflux disease without esophagitis: Secondary | ICD-10-CM | POA: Diagnosis not present

## 2023-11-13 DIAGNOSIS — I8501 Esophageal varices with bleeding: Secondary | ICD-10-CM

## 2023-11-13 DIAGNOSIS — G8929 Other chronic pain: Secondary | ICD-10-CM | POA: Diagnosis present

## 2023-11-13 DIAGNOSIS — R197 Diarrhea, unspecified: Secondary | ICD-10-CM | POA: Diagnosis present

## 2023-11-13 DIAGNOSIS — Z833 Family history of diabetes mellitus: Secondary | ICD-10-CM

## 2023-11-13 DIAGNOSIS — J441 Chronic obstructive pulmonary disease with (acute) exacerbation: Secondary | ICD-10-CM | POA: Diagnosis present

## 2023-11-13 DIAGNOSIS — K921 Melena: Secondary | ICD-10-CM | POA: Diagnosis not present

## 2023-11-13 DIAGNOSIS — E872 Acidosis, unspecified: Secondary | ICD-10-CM

## 2023-11-13 DIAGNOSIS — Z781 Physical restraint status: Secondary | ICD-10-CM

## 2023-11-13 DIAGNOSIS — N179 Acute kidney failure, unspecified: Secondary | ICD-10-CM | POA: Diagnosis not present

## 2023-11-13 DIAGNOSIS — E876 Hypokalemia: Secondary | ICD-10-CM | POA: Diagnosis present

## 2023-11-13 DIAGNOSIS — R0602 Shortness of breath: Secondary | ICD-10-CM | POA: Diagnosis present

## 2023-11-13 DIAGNOSIS — E8770 Fluid overload, unspecified: Secondary | ICD-10-CM | POA: Diagnosis present

## 2023-11-13 DIAGNOSIS — K7460 Unspecified cirrhosis of liver: Secondary | ICD-10-CM | POA: Diagnosis present

## 2023-11-13 DIAGNOSIS — J440 Chronic obstructive pulmonary disease with acute lower respiratory infection: Secondary | ICD-10-CM | POA: Diagnosis present

## 2023-11-13 DIAGNOSIS — R578 Other shock: Secondary | ICD-10-CM | POA: Diagnosis present

## 2023-11-13 DIAGNOSIS — D62 Acute posthemorrhagic anemia: Secondary | ICD-10-CM

## 2023-11-13 DIAGNOSIS — R6521 Severe sepsis with septic shock: Secondary | ICD-10-CM

## 2023-11-13 DIAGNOSIS — Z978 Presence of other specified devices: Secondary | ICD-10-CM | POA: Diagnosis not present

## 2023-11-13 DIAGNOSIS — N170 Acute kidney failure with tubular necrosis: Secondary | ICD-10-CM | POA: Diagnosis present

## 2023-11-13 DIAGNOSIS — J15212 Pneumonia due to Methicillin resistant Staphylococcus aureus: Secondary | ICD-10-CM | POA: Diagnosis present

## 2023-11-13 DIAGNOSIS — M545 Low back pain, unspecified: Secondary | ICD-10-CM | POA: Diagnosis present

## 2023-11-13 DIAGNOSIS — Z79899 Other long term (current) drug therapy: Secondary | ICD-10-CM

## 2023-11-13 DIAGNOSIS — J189 Pneumonia, unspecified organism: Secondary | ICD-10-CM | POA: Diagnosis not present

## 2023-11-13 DIAGNOSIS — T182XXA Foreign body in stomach, initial encounter: Secondary | ICD-10-CM | POA: Diagnosis not present

## 2023-11-13 DIAGNOSIS — M542 Cervicalgia: Secondary | ICD-10-CM | POA: Diagnosis present

## 2023-11-13 DIAGNOSIS — J9691 Respiratory failure, unspecified with hypoxia: Secondary | ICD-10-CM

## 2023-11-13 DIAGNOSIS — Z803 Family history of malignant neoplasm of breast: Secondary | ICD-10-CM

## 2023-11-13 DIAGNOSIS — Z66 Do not resuscitate: Secondary | ICD-10-CM | POA: Diagnosis not present

## 2023-11-13 DIAGNOSIS — M7989 Other specified soft tissue disorders: Secondary | ICD-10-CM | POA: Diagnosis present

## 2023-11-13 DIAGNOSIS — R579 Shock, unspecified: Secondary | ICD-10-CM | POA: Diagnosis not present

## 2023-11-13 DIAGNOSIS — F17210 Nicotine dependence, cigarettes, uncomplicated: Secondary | ICD-10-CM | POA: Diagnosis present

## 2023-11-13 DIAGNOSIS — Z6838 Body mass index (BMI) 38.0-38.9, adult: Secondary | ICD-10-CM

## 2023-11-13 DIAGNOSIS — Z515 Encounter for palliative care: Secondary | ICD-10-CM

## 2023-11-13 DIAGNOSIS — E8809 Other disorders of plasma-protein metabolism, not elsewhere classified: Secondary | ICD-10-CM | POA: Diagnosis present

## 2023-11-13 LAB — CBC WITH DIFFERENTIAL/PLATELET
Abs Immature Granulocytes: 0.12 10*3/uL — ABNORMAL HIGH (ref 0.00–0.07)
Basophils Absolute: 0 10*3/uL (ref 0.0–0.1)
Basophils Relative: 0 %
Eosinophils Absolute: 0.1 10*3/uL (ref 0.0–0.5)
Eosinophils Relative: 1 %
HCT: 25 % — ABNORMAL LOW (ref 39.0–52.0)
Hemoglobin: 8.1 g/dL — ABNORMAL LOW (ref 13.0–17.0)
Immature Granulocytes: 1 %
Lymphocytes Relative: 20 %
Lymphs Abs: 2.4 10*3/uL (ref 0.7–4.0)
MCH: 30.6 pg (ref 26.0–34.0)
MCHC: 32.4 g/dL (ref 30.0–36.0)
MCV: 94.3 fL (ref 80.0–100.0)
Monocytes Absolute: 1.6 10*3/uL — ABNORMAL HIGH (ref 0.1–1.0)
Monocytes Relative: 13 %
Neutro Abs: 8.1 10*3/uL — ABNORMAL HIGH (ref 1.7–7.7)
Neutrophils Relative %: 65 %
Platelets: 166 10*3/uL (ref 150–400)
RBC: 2.65 MIL/uL — ABNORMAL LOW (ref 4.22–5.81)
RDW: 18 % — ABNORMAL HIGH (ref 11.5–15.5)
WBC: 12.3 10*3/uL — ABNORMAL HIGH (ref 4.0–10.5)
nRBC: 0 % (ref 0.0–0.2)

## 2023-11-13 LAB — BASIC METABOLIC PANEL
Anion gap: 22 — ABNORMAL HIGH (ref 5–15)
BUN: 28 mg/dL — ABNORMAL HIGH (ref 6–20)
CO2: 14 mmol/L — ABNORMAL LOW (ref 22–32)
Calcium: 7.4 mg/dL — ABNORMAL LOW (ref 8.9–10.3)
Chloride: 108 mmol/L (ref 98–111)
Creatinine, Ser: 1.7 mg/dL — ABNORMAL HIGH (ref 0.61–1.24)
GFR, Estimated: 48 mL/min — ABNORMAL LOW (ref >60)
Glucose, Bld: 130 mg/dL — ABNORMAL HIGH (ref 70–99)
Potassium: 3 mmol/L — ABNORMAL LOW (ref 3.5–5.1)
Sodium: 144 mmol/L (ref 135–145)

## 2023-11-13 LAB — BLOOD GAS, ARTERIAL
Acid-Base Excess: 0.4 mmol/L (ref 0.0–2.0)
Acid-base deficit: 9.2 mmol/L — ABNORMAL HIGH (ref 0.0–2.0)
Bicarbonate: 22.3 mmol/L (ref 20.0–28.0)
Bicarbonate: 18 mmol/L — ABNORMAL LOW (ref 20.0–28.0)
Drawn by: 27016
FIO2: 36 %
O2 Saturation: 96.1 %
O2 Saturation: 99 %
Patient temperature: 37.4
Patient temperature: 36.9
pCO2 arterial: 28 mmHg — ABNORMAL LOW (ref 32–48)
pCO2 arterial: 43 mmHg (ref 32–48)
pH, Arterial: 7.5 — ABNORMAL HIGH (ref 7.35–7.45)
pH, Arterial: 7.23 — ABNORMAL LOW (ref 7.35–7.45)
pO2, Arterial: 66 mmHg — ABNORMAL LOW (ref 83–108)
pO2, Arterial: 105 mmHg (ref 83–108)

## 2023-11-13 LAB — RAPID URINE DRUG SCREEN, HOSP PERFORMED
Amphetamines: NOT DETECTED
Barbiturates: NOT DETECTED
Benzodiazepines: NOT DETECTED
Cocaine: NOT DETECTED
Opiates: NOT DETECTED
Tetrahydrocannabinol: NOT DETECTED

## 2023-11-13 LAB — CBC
HCT: 26.4 % — ABNORMAL LOW (ref 39.0–52.0)
Hemoglobin: 8.2 g/dL — ABNORMAL LOW (ref 13.0–17.0)
MCH: 30.9 pg (ref 26.0–34.0)
MCHC: 31.1 g/dL (ref 30.0–36.0)
MCV: 99.6 fL (ref 80.0–100.0)
Platelets: 190 10*3/uL (ref 150–400)
RBC: 2.65 MIL/uL — ABNORMAL LOW (ref 4.22–5.81)
RDW: 18.4 % — ABNORMAL HIGH (ref 11.5–15.5)
WBC: 20.9 10*3/uL — ABNORMAL HIGH (ref 4.0–10.5)
nRBC: 0.1 % (ref 0.0–0.2)

## 2023-11-13 LAB — PREPARE RBC (CROSSMATCH)

## 2023-11-13 LAB — RESPIRATORY PANEL BY PCR

## 2023-11-13 LAB — LACTIC ACID, PLASMA
Lactic Acid, Venous: 4.6 mmol/L (ref 0.5–1.9)
Lactic Acid, Venous: 5.3 mmol/L (ref 0.5–1.9)

## 2023-11-13 LAB — ETHANOL: Alcohol, Ethyl (B): <10 mg/dL (ref <10)

## 2023-11-13 LAB — COMPREHENSIVE METABOLIC PANEL
ALT: 22 U/L (ref 0–44)
AST: 85 U/L — ABNORMAL HIGH (ref 15–41)
Albumin: 2.3 g/dL — ABNORMAL LOW (ref 3.5–5.0)
Alkaline Phosphatase: 87 U/L (ref 38–126)
Anion gap: 12 (ref 5–15)
BUN: 20 mg/dL (ref 6–20)
CO2: 22 mmol/L (ref 22–32)
Calcium: 7.7 mg/dL — ABNORMAL LOW (ref 8.9–10.3)
Chloride: 107 mmol/L (ref 98–111)
Creatinine, Ser: 1.36 mg/dL — ABNORMAL HIGH (ref 0.61–1.24)
GFR, Estimated: >60 mL/min (ref >60)
Glucose, Bld: 122 mg/dL — ABNORMAL HIGH (ref 70–99)
Potassium: 2.6 mmol/L — CL (ref 3.5–5.1)
Sodium: 141 mmol/L (ref 135–145)
Total Bilirubin: 4.4 mg/dL — ABNORMAL HIGH (ref 0.0–1.2)
Total Protein: 6 g/dL — ABNORMAL LOW (ref 6.5–8.1)

## 2023-11-13 LAB — RESP PANEL BY RT-PCR (RSV, FLU A&B, COVID)  RVPGX2
Influenza A by PCR: NEGATIVE
Influenza B by PCR: NEGATIVE
Resp Syncytial Virus by PCR: NEGATIVE
SARS Coronavirus 2 by RT PCR: NEGATIVE

## 2023-11-13 LAB — MRSA NEXT GEN BY PCR, NASAL: MRSA by PCR Next Gen: NOT DETECTED

## 2023-11-13 LAB — CBG MONITORING, ED: Glucose-Capillary: 94 mg/dL (ref 70–99)

## 2023-11-13 LAB — PROTIME-INR
INR: 1.6 — ABNORMAL HIGH (ref 0.8–1.2)
Prothrombin Time: 18.8 s — ABNORMAL HIGH (ref 11.4–15.2)

## 2023-11-13 LAB — HIV ANTIBODY (ROUTINE TESTING W REFLEX): HIV Screen 4th Generation wRfx: NONREACTIVE

## 2023-11-13 LAB — ABO/RH: ABO/RH(D): A POS

## 2023-11-13 LAB — POC OCCULT BLOOD, ED: Fecal Occult Bld: POSITIVE — AB

## 2023-11-13 LAB — MAGNESIUM: Magnesium: 1.8 mg/dL (ref 1.7–2.4)

## 2023-11-13 LAB — BRAIN NATRIURETIC PEPTIDE: B Natriuretic Peptide: 97 pg/mL (ref 0.0–100.0)

## 2023-11-13 MED ORDER — PROPOFOL 1000 MG/100ML IV EMUL
INTRAVENOUS | Status: AC
  Administered 2023-11-13: 5 ug/kg/min via INTRAVENOUS
  Filled 2023-11-13: qty 100

## 2023-11-13 MED ORDER — SODIUM CHLORIDE 0.9 % IV SOLN
2.0000 g | Freq: Three times a day (TID) | INTRAVENOUS | Status: DC
  Administered 2023-11-13 – 2023-11-14 (×2): 2 g via INTRAVENOUS
  Filled 2023-11-13 (×3): qty 12.5

## 2023-11-13 MED ORDER — FUROSEMIDE 10 MG/ML IJ SOLN
40.0000 mg | Freq: Once | INTRAMUSCULAR | Status: AC
  Administered 2023-11-13: 40 mg via INTRAVENOUS
  Filled 2023-11-13: qty 4

## 2023-11-13 MED ORDER — ALBUTEROL SULFATE (2.5 MG/3ML) 0.083% IN NEBU
INHALATION_SOLUTION | RESPIRATORY_TRACT | Status: AC
  Filled 2023-11-13: qty 6

## 2023-11-13 MED ORDER — HALOPERIDOL LACTATE 5 MG/ML IJ SOLN
5.0000 mg | Freq: Once | INTRAMUSCULAR | Status: AC
  Administered 2023-11-13: 5 mg via INTRAVENOUS

## 2023-11-13 MED ORDER — POTASSIUM CHLORIDE CRYS ER 20 MEQ PO TBCR
40.0000 meq | EXTENDED_RELEASE_TABLET | ORAL | Status: DC
  Administered 2023-11-13: 40 meq via ORAL
  Filled 2023-11-13: qty 2

## 2023-11-13 MED ORDER — HALOPERIDOL LACTATE 5 MG/ML IJ SOLN
5.0000 mg | Freq: Once | INTRAMUSCULAR | Status: AC
  Administered 2023-11-13: 5 mg via INTRAVENOUS
  Filled 2023-11-13: qty 1

## 2023-11-13 MED ORDER — ETOMIDATE 2 MG/ML IV SOLN
INTRAVENOUS | Status: AC
  Administered 2023-11-13: 20 mg
  Filled 2023-11-13: qty 10

## 2023-11-13 MED ORDER — VANCOMYCIN HCL 2000 MG/400ML IV SOLN: 2000.0000 mg | INTRAVENOUS | Status: DC

## 2023-11-13 MED ORDER — PANTOPRAZOLE SODIUM 40 MG IV SOLR
40.0000 mg | INTRAVENOUS | Status: AC
  Administered 2023-11-13 (×2): 40 mg via INTRAVENOUS
  Filled 2023-11-13: qty 10

## 2023-11-13 MED ORDER — SODIUM CHLORIDE 0.9 % IV SOLN
25.0000 ug/min | INTRAVENOUS | Status: DC
  Administered 2023-11-13: 25 ug/min via INTRAVENOUS
  Filled 2023-11-13: qty 2

## 2023-11-13 MED ORDER — CEFTRIAXONE SODIUM 2 G IJ SOLR
2.0000 g | Freq: Once | INTRAMUSCULAR | Status: AC
  Administered 2023-11-13: 2 g via INTRAVENOUS
  Filled 2023-11-13: qty 20

## 2023-11-13 MED ORDER — PROPOFOL 1000 MG/100ML IV EMUL
5.0000 ug/kg/min | INTRAVENOUS | Status: DC
  Administered 2023-11-14 (×2): 50 ug/kg/min via INTRAVENOUS
  Filled 2023-11-13 (×3): qty 100

## 2023-11-13 MED ORDER — MORPHINE SULFATE (PF) 2 MG/ML IV SOLN: 2.0000 mg | INTRAVENOUS | Status: DC | PRN

## 2023-11-13 MED ORDER — HALOPERIDOL LACTATE 5 MG/ML IJ SOLN
INTRAMUSCULAR | Status: AC
  Filled 2023-11-13: qty 1

## 2023-11-13 MED ORDER — FENTANYL CITRATE PF 50 MCG/ML IJ SOSY
25.0000 ug | PREFILLED_SYRINGE | INTRAMUSCULAR | Status: DC | PRN
  Administered 2023-11-13: 25 ug via INTRAVENOUS
  Filled 2023-11-13: qty 1

## 2023-11-13 MED ORDER — VANCOMYCIN HCL IN DEXTROSE 1-5 GM/200ML-% IV SOLN
1000.0000 mg | Freq: Once | INTRAVENOUS | Status: AC
  Administered 2023-11-13: 1000 mg via INTRAVENOUS
  Filled 2023-11-13: qty 200

## 2023-11-13 MED ORDER — LABETALOL HCL 5 MG/ML IV SOLN: 10.0000 mg | INTRAVENOUS | Status: DC | PRN

## 2023-11-13 MED ORDER — DIPHENHYDRAMINE HCL 50 MG/ML IJ SOLN
25.0000 mg | Freq: Once | INTRAMUSCULAR | Status: AC
  Administered 2023-11-13: 25 mg via INTRAVENOUS
  Filled 2023-11-13: qty 1

## 2023-11-13 MED ORDER — HYDRALAZINE HCL 20 MG/ML IJ SOLN: 10.0000 mg | Freq: Four times a day (QID) | INTRAMUSCULAR | Status: DC | PRN

## 2023-11-13 MED ORDER — LORAZEPAM 2 MG/ML IJ SOLN
1.0000 mg | Freq: Once | INTRAMUSCULAR | Status: AC
  Administered 2023-11-13: 1 mg via INTRAVENOUS
  Filled 2023-11-13: qty 1

## 2023-11-13 MED ORDER — HYDROCODONE-ACETAMINOPHEN 5-325 MG PO TABS: 1.0000 | ORAL_TABLET | Freq: Four times a day (QID) | ORAL | Status: DC | PRN

## 2023-11-13 MED ORDER — PANTOPRAZOLE SODIUM 40 MG IV SOLR
40.0000 mg | Freq: Two times a day (BID) | INTRAVENOUS | Status: DC
  Administered 2023-11-14: 40 mg via INTRAVENOUS
  Filled 2023-11-13 (×2): qty 10

## 2023-11-13 MED ORDER — METHYLPREDNISOLONE SODIUM SUCC 125 MG IJ SOLR
125.0000 mg | Freq: Once | INTRAMUSCULAR | Status: AC
  Administered 2023-11-13: 125 mg via INTRAVENOUS
  Filled 2023-11-13: qty 2

## 2023-11-13 MED ORDER — IPRATROPIUM-ALBUTEROL 0.5-2.5 (3) MG/3ML IN SOLN
3.0000 mL | RESPIRATORY_TRACT | Status: DC
  Administered 2023-11-13 – 2023-11-14 (×5): 3 mL via RESPIRATORY_TRACT
  Filled 2023-11-13 (×5): qty 3

## 2023-11-13 MED ORDER — LORAZEPAM 2 MG/ML IJ SOLN
2.0000 mg | Freq: Once | INTRAMUSCULAR | Status: AC
  Administered 2023-11-13: 2 mg via INTRAVENOUS
  Filled 2023-11-13: qty 1

## 2023-11-13 MED ORDER — SODIUM CHLORIDE 0.9% IV SOLUTION: Freq: Once | INTRAVENOUS | Status: DC

## 2023-11-13 MED ORDER — VANCOMYCIN HCL 1500 MG/300ML IV SOLN
1500.0000 mg | Freq: Once | INTRAVENOUS | Status: AC
  Administered 2023-11-13: 1500 mg via INTRAVENOUS
  Filled 2023-11-13: qty 300

## 2023-11-13 MED ORDER — ACETAMINOPHEN 325 MG PO TABS: 650.0000 mg | ORAL_TABLET | Freq: Four times a day (QID) | ORAL | Status: DC | PRN

## 2023-11-13 MED ORDER — POTASSIUM CHLORIDE 2 MEQ/ML IV SOLN
INTRAVENOUS | Status: DC
  Filled 2023-11-13: qty 1000

## 2023-11-13 MED ORDER — METHYLPREDNISOLONE SODIUM SUCC 40 MG IJ SOLR: 40.0000 mg | Freq: Two times a day (BID) | INTRAMUSCULAR | Status: DC

## 2023-11-13 MED ORDER — SODIUM CHLORIDE 0.9 % IV SOLN
250.0000 mL | INTRAVENOUS | Status: DC
  Administered 2023-11-13: 250 mL via INTRAVENOUS

## 2023-11-13 MED ORDER — SUCCINYLCHOLINE CHLORIDE 200 MG/10ML IV SOSY
PREFILLED_SYRINGE | INTRAVENOUS | Status: AC
  Administered 2023-11-13: 120 mg
  Filled 2023-11-13: qty 10

## 2023-11-13 MED ORDER — SODIUM CHLORIDE 0.9 % IV SOLN
500.0000 mg | Freq: Once | INTRAVENOUS | Status: AC
  Administered 2023-11-13: 500 mg via INTRAVENOUS
  Filled 2023-11-13: qty 5

## 2023-11-13 NOTE — ED Notes (Addendum)
 Skylar Lafountain, daughter in law to be contacted if needed [phone]

## 2023-11-13 NOTE — Progress Notes (Addendum)
 This Investment banker, operational assisting in patient care. Patient confused, combative, attempting to pull at lines & get out of bed. Patient having multiple black stools. Patient cleaned up and then restrained. Haldol given. Family in room & educated on patient safety.

## 2023-11-13 NOTE — H&P (Signed)
 History and Physical    Patient: Chad Glass:[id number] DOB: 07/22/1971 DOA: 11/13/2023 DOS: the patient was seen and examined on 11/13/2023 PCP: Carmel Sacramento, NP  Patient coming from: Home  Chief Complaint:  Chief Complaint  Patient presents with   Shortness of Breath   HPI: Chad Glass is a 53 y.o. male with medical history significant of COPD, chronic pain, anxiety, who presents with shortness of breath, lower extremity edema, hematemesis, melena. Pt reports he has been coughing and SOB for the last few days that has gotten progressively worse. He reports multiple family members are also sick. He also reports swelling in his legs that has been getting worse. Reports the swelling began with the onset of his respiratory illness. Also endorses hematemesis 3-4x a day for the last 4 days. Also endorses melena and diarrhea for the last 3-4 days.  Endorses subjective fever.  He has never had a colonoscopy or endoscopy. He endorses a 20-pack-year smoking history,and history of alcohol use though has a difficult time quantifying how much.  Reports he quit over 1 year ago.  In the last 2 years he has had significant stress and endorses the loss of his wife, son, and multiple other family members.  He reports he currently resides with his fiance and other children.  He does have a PCP but cannot recall who it is with.  No records available in chart review.  Initial labs in the ED he is found to be septic. Labs significant for hypokalemia to 2.6, AKI with creatinine of 1.36, hypocalcemia, AST 85 ALT 22, leukocytosis of 12.3, Hgb 8.1, lactic acidosis.  Diagnostics reveal left lower lobe opacity consistent with pneumonia.  He is tachycardic consistently in the 120s but EKG revealed sinus rhythm.  He is hypoxic with improvement on 2 L nasal cannula. 1 unit PRBC was ordered, ceftriaxone and azithromycin were initiated.  Gastroenterology at [facility] was consulted but given patient's  instability preference is to transfer to [facility].   Review of Systems: As mentioned in the history of present illness. All other systems reviewed and are negative. Past Medical History:  Diagnosis Date   Anxiety    Chronic back pain    Chronic neck pain    Chronic obstructive pulmonary disease, unspecified (HCC)    Depression    Gastro-esophageal reflux disease with esophagitis    GERD (gastroesophageal reflux disease)    Low back pain    Major depressive disorder, single episode, unspecified    Other hemorrhoids    Sleep apnea    CiPaP at night   Sleep apnea, unspecified    Past Surgical History:  Procedure Laterality Date   KNEE SURGERY     right-cut around the cartilage. Dr. Romeo Apple at [facility]   UMBILICAL HERNIA REPAIR  11/30/2011   Procedure: HERNIA REPAIR UMBILICAL ADULT;  Surgeon: Fabio Bering, MD;  Location: [facility];  Service: General;  Laterality: N/A;   Social History:  reports that he has been smoking cigarettes. He has a 22.5 pack-year smoking history. He does not have any smokeless tobacco history on file. He reports that he does not drink alcohol and does not use drugs. 1.5PPD for at least 20 years Denies alcohol in the last 1 year, previously drank daily. Cannot report how much.  No Known Allergies  Family History  Problem Relation Age of Onset   Breast cancer Mother    Arthritis Other    Diabetes Other    Anesthesia problems  Neg Hx    Hypotension Neg Hx    Pseudochol deficiency Neg Hx    Malignant hyperthermia Neg Hx     Prior to Admission medications   Medication Sig Start Date End Date Taking? Authorizing Provider  cetirizine (ZYRTEC) 10 MG tablet Take 10 mg by mouth daily.    [provider]  clonazePAM (KLONOPIN) 0.5 MG tablet Take 0.5 mg by mouth 2 (two) times daily as needed for anxiety. 04/06/22   [provider]  cyclobenzaprine (FLEXERIL) 5 MG tablet Take 5 mg by mouth 3 (three) times daily as needed for muscle  spasms.    [provider]  DULoxetine (CYMBALTA) 30 MG capsule Take 90 mg by mouth daily.    [provider]  oxyCODONE (ROXICODONE) 15 MG immediate release tablet Take 15 mg by mouth 4 (four) times daily as needed for pain.    [provider]  pantoprazole (PROTONIX) 40 MG tablet Take 1 tablet (40 mg total) by mouth 2 (two) times daily before a meal. 06/01/22   Mahon, Frederik Schmidt, NP  pregabalin (LYRICA) 75 MG capsule Take 75 mg by mouth at bedtime. 06/07/22   [provider]  propranolol ER (INDERAL LA) 120 MG 24 hr capsule Take 120 mg by mouth daily. 07/07/22   [provider]  sertraline (ZOLOFT) 50 MG tablet Take 50 mg by mouth daily. 01/23/22   [provider]  Sod Picosulfate-Mag Ox-Cit Acd (CLENPIQ) 10-3.5-12 MG-GM -GM/175ML SOLN Take 1 kit by mouth as directed. 06/22/22   Rourk, Gerrit Friends, MD  XYOSTED 75 MG/0.5ML SOAJ Inject 75 mg into the skin every 7 (seven) days. 01/26/22   [provider]    Physical Exam: Vitals:   11/13/23 1400 11/13/23 1415 11/13/23 1430 11/13/23 1445  BP: 113/60 (!) 122/48 117/63 112/65  Pulse: (!) 118 (!) 119 (!) 120 (!) 114  Resp: (!) 25 (!) 34  (!) 25  Temp:      TempSrc:      SpO2: 93% 95% 92% 91%  Weight:      Height:       Constitutional: toxic appearing, tired. Morbidly obese HENT: Head Normocephalic and atraumatic.  Mucous membranes are moist.  Eyes:  Extraocular intact. Conjunctivae normal. Pupils are equal, round, and reactive to light.  Cardiovascular: tachycardia, regular rhythm Pulmonary: labored, shallow respirations, exam limited by body habitus, slight end expiratory wheeze appreciated Musculoskeletal:  Normal range of motion.  Skin: warm and dry. not jaundiced. Extremities: 2+ pitting edema  Neurological: No focal deficit present. alert. Oriented. Speech unintelligible at times.  Psychiatric: Mood and Affect congruent.   Data Reviewed:  Lab Results  Component Value Date    WBC 12.3 (H) 11/13/2023   HGB 8.1 (L) 11/13/2023   HCT 25.0 (L) 11/13/2023   MCV 94.3 11/13/2023   PLT 166 11/13/2023      Latest Ref Rng & Units 11/13/2023   12:39 PM 02/07/2016   12:00 AM 11/23/2011    3:00 PM  BMP  Glucose 70 - 99 mg/dL 295   91   BUN 6 - 20 mg/dL 20  10     13    Creatinine 0.61 - 1.24 mg/dL 6.21  1.1     3.08   Sodium 135 - 145 mmol/L 141  145     139   Potassium 3.5 - 5.1 mmol/L 2.6  4.2     3.6   Chloride 98 - 111 mmol/L 107  111     106  CO2 22 - 32 mmol/L 22  24     21    Calcium 8.9 - 10.3 mg/dL 7.7   9.3      This result is from an external source.   Resp Readings from Last 3 Encounters:  11/13/23 (!) 25  02/16/22 18  11/30/11 20   PF Readings from Last 3 Encounters:  No data found for PF     Assessment and Plan: Sepsis Lactic Acidosis Leukocytosis -Criteria met on arrival with tachycardia, tachypnea, hypoxia, leukocytosis, lactic acidosis. Presumed source: Pneumonia - Patient is acutely volume overloaded, will hold off on further IV fluids - Continue IV antibiotics as below  Hematemesis Melena - Patient does have history of BRBPR in 2023 - Followed with gastroenterology but never received EGD or colonoscopy as planned - GI consulted and has requested transfer to [facility] given patient's instability  Acute blood loss anemia -- No hemoglobin available for baseline.  On arrival hemoglobin 8.1 - Patient with active hematemesis in the ED.  1 unit PRBC has been ordered - Trend CBC - INR 1.6  Acute Hypoxic Respiratory Failure - Cont supplemental O2, wean as tolerated - 2/2 to COPD and CAP as below.  COPD, with acute exacerbation - Will proceed with solumedrol 125mg  x1 then BID. Taper to PO as tolerated -- Resume home inhalers when meds reconciled -- Scheduled duonebs -- RSV/Flu/COVID neg. 20 pathogen panel pending  CAP - Pneumonia seen on CXR -Will order strep pneumo antigens and Legionella.  Follow. - For now continue with  ceftriaxone and azithromycin as initiated in the ED - Respiratory viral panel is negative so far  Lower extremity edema - Patient denies history of heart failure, BNP less than 100 - Patient does appear clinically volume overloaded, proceed with lasix 40mg  IVP and monitor Is and os -- ECHO pending -- Doppler pending  AKI - No Cr for baseline - Trend - Avoid nephrotoxic meds when possible  Hypokalemia - Potassium 2.6 on arrival. Replacing now.  Hypoalbuminemia - Suspect severe malnourishment based on body habitus. Will consult RD  Elevated transaminases - AST 85, ALT WNL. Does have etoh history, not a current drinker. - ETOH level WNL on arrival  Morbid Obesity BMI 38 - Complicates the above. Given degree of central obesity suspect pt may have some component of OHS at baseline - Outpatient follow up for lifestyle modification and risk factor management  Chronic Pain Anxiety - Resume home meds   Advance Care Planning:   Code Status: Full Code  Consults: Gastroenterology consulted, will need new consult upon arrival to [facility]  Family Communication: discussed directly with patient and his nephew at bedside  Severity of Illness: The appropriate patient status for this patient is INPATIENT. Inpatient status is judged to be reasonable and necessary in order to provide the required intensity of service to ensure the patient's safety. The patient's presenting symptoms, physical exam findings, and initial radiographic and laboratory data in the context of their chronic comorbidities is felt to place them at high risk for further clinical deterioration. Furthermore, it is not anticipated that the patient will be medically stable for discharge from the hospital within 2 midnights of admission.   * I certify that at the point of admission it is my clinical judgment that the patient will require inpatient hospital care spanning beyond 2 midnights from the point of admission due to high  intensity of service, high risk for further deterioration and high frequency of surveillance required.*  Author: Debarah Crape,  DO 11/13/2023 2:57 PM  For on call review [email or web address].

## 2023-11-13 NOTE — ED Notes (Signed)
 NOT placing patient on Bipap at this time. He is extremely confused and combative. He is breathing rapidly at this time but his SPO2 is holding on RA. Bipap remains in the room for when patient is a little more calm.

## 2023-11-13 NOTE — ED Notes (Signed)
 Patient transported to CT

## 2023-11-13 NOTE — Progress Notes (Signed)
 Critical Note  Patient was a 53year-old male with PMH of COPD, anxiety, chronic pain who was admitted earlier today due to acute respiratory failure with hypoxia secondary to sepsis due to community-acquired pneumonia with superimposed COPD exacerbation as well as acute blood loss anemia due to hematemesis and melena.  Apparently, patient was agitated after admission and was treated with 1 mg of IV Ativan x 1, 2 mg of IV Ativan x 1, IV diphenhydramine 25 mg x 1 and IV Haldol 5 mg x 2. I was notified by RN to evaluate patient due to patient being somnolent and uncooperative with supplemental oxygen.  At bedside, patient was lethargic, he was nonresponsive to tactile and painful stimuli and was unable to protect airway.  It was decided to intubate patient to protect his airway, EDP  agreed and patient was intubated. Patient was started on IV propofol for sedation and IV fentanyl pushes as needed was added due to gagging over the ET tube.  Patient was also started on IV Neo-Synephrine due to soft BP.     Latest Ref Rng & Units 11/13/2023    9:43 PM 11/13/2023   12:39 PM 02/07/2016   12:00 AM  CBC  WBC 4.0 - 10.5 K/uL 20.9  12.3  9.0      Hemoglobin 13.0 - 17.0 g/dL 8.2  8.1  16.1      Hematocrit 39.0 - 52.0 % 26.4  25.0  47      Platelets 150 - 400 K/uL 190  166  223         This result is from an external source.      Latest Ref Rng & Units 11/13/2023    9:43 PM 11/13/2023   12:39 PM 02/07/2016   12:00 AM  BMP  Glucose 70 - 99 mg/dL 096  045    BUN 6 - 20 mg/dL 28  20  10       Creatinine 0.61 - 1.24 mg/dL 4.09  8.11  1.1      Sodium 135 - 145 mmol/L 144  141  145      Potassium 3.5 - 5.1 mmol/L 3.0  2.6  4.2      Chloride 98 - 111 mmol/L 108  107  111      CO2 22 - 32 mmol/L 14  22  24       Calcium 8.9 - 10.3 mg/dL 7.4  7.7       This result is from an external source.    ABG    Component Value Date/Time   PHART 7.23 (L) 11/13/2023 2127   PCO2ART 43 11/13/2023 2127   PO2ART 105  11/13/2023 2127   HCO3 18.0 (L) 11/13/2023 2127   ACIDBASEDEF 9.2 (H) 11/13/2023 2127   O2SAT 99 11/13/2023 2127    CT chest without contrast showed 1. Right mainstem intubation, with endotracheal tube tip at the origin of the right mainstem bronchus. Recommend retracting 2.5 cm. 2. Dense areas of consolidation within the right upper lobe, lingula, and left lower lobe. Findings are consistent with a combination of pneumonia and atelectasis. 3. Upper abdominal ascites. 4. Bilateral gynecomastia.   BP (!) 120/57   Pulse (!) 131   Temp 98.5 F (36.9 C) (Axillary)   Resp (!) 29   Ht 6' (1.829 m)   Wt 127 kg   SpO2 99%   BMI 37.97 kg/m    Assessment and plan Acute respiratory failure with hypoxia requiring IPPV Patient was intubated and sedated with  IV propofol with IV fentanyl pushes as needed Continue IV Neo-Synephrine to maintain MAP >/= 65 and above PCCM (Dr. Larinda Buttery) was consulted and accepted patient to [facility] ICU  Sepsis secondary to CAP POA with superimposed COPD exacerbation Continue management as described by admitting physician  Lactic acidosis possibly due to multifactorial Lactic acid 4.6 > 5.3 This may be due to patient's septic state, along with hypoxia since he was uncooperative with supplemental oxygen on admission  Hypokalemia K+ 3.0, this will be replenished  GI bleed Hemoglobin was 8.2 status post blood transfusion (8.1) H/H will be rechecked   Critical time: ******* minutes   Critical care personally provided  managing the patient due to high probability of clinically significant and life threatening deterioration. This critical care time included obtaining a history; examining the patient, pulse oximetry; ordering and review of studies; arranging urgent treatment with development of a management plan; evaluation of patient's response of treatment; frequent reassessment; and discussions with other providers.  This critical care time was performed to assess  and manage the high probability of imminent and life threatening deterioration that could result in multi-organ failure.   Please refer to admission H&P for details regarding the care of this patient

## 2023-11-13 NOTE — ED Provider Notes (Signed)
 Patient is septic from pneumonia with an upper GI bleed.  He has been admitted to the hospitalist service over progressive care at [facility].  Patient decompensated and was not protecting his airway.  Hospitalist asked me to intubate the patient.  Patient with intubated without problems.  Procedure Name: Intubation Date/Time: 11/13/2023 8:42 PM  Performed by: Bethann Berkshire, MDPre-anesthesia Checklist: Patient identified Oxygen Delivery Method: Non-rebreather mask Preoxygenation: Pre-oxygenation with 100% oxygen Induction Type: IV induction Ventilation: Mask ventilation without difficulty Laryngoscope Size: 1 Grade View: Grade III Tube size: 7.5 mm Number of attempts: 1 Airway Equipment and Method: Patient positioned with wedge pillow Placement Confirmation: ETT inserted through vocal cords under direct vision and CO2 detector Difficulty Due To: Difficulty was anticipated Comments: Patient was intubated with extreme problems.        Bethann Berkshire, MD 11/13/23 2049

## 2023-11-13 NOTE — ED Notes (Signed)
 Admitting MD at bedside.

## 2023-11-13 NOTE — ED Notes (Signed)
[facility] at bedside for transport. 

## 2023-11-13 NOTE — ED Notes (Signed)
 AC contacted for Neo-synephrine

## 2023-11-13 NOTE — ED Notes (Signed)
 Difficulty placing urinary catheter by 2 RNs. Coude catheter placed without difficulty, initial blood clot noted at end of catheter tubing then urine return

## 2023-11-13 NOTE — ED Notes (Signed)
 Patient is thrashing around in bed yelling and cannot get relaxed.

## 2023-11-13 NOTE — ED Notes (Addendum)
 Pt placed on NRB, unable to get accurate reading on pulse ox; probe switched to finger; resp rate 40.

## 2023-11-13 NOTE — ED Notes (Signed)
 Pt confused and attempting to get out of bed and be aggressive with family. Family states pt used to drink a lot and get mean but hasn't drank in 8 months. Pt is confused and redirection is hard. Attending notified and waiting on orders.

## 2023-11-13 NOTE — ED Notes (Signed)
 Pt transport to and from CT without incident

## 2023-11-13 NOTE — Progress Notes (Signed)
 My review of labs, imaging, notes and other tests is significant for notified by RN patient is too agitated to keep O2 in place. Minimal response to initial dose haldol. will repeat 5mg  Haldol, 2mg  Ativan, 25mg  IV Benadryl. If persistently agitated beyond this, may require chemical sedation and intubation.

## 2023-11-13 NOTE — Progress Notes (Signed)
 My review of labs, imaging, notes and other tests is significant for worsening lactic acidosis.  Patient is becoming confused and trying to get out of bed.  ABG ordered and reviewed. RT to place BIPAP. Broaden Abx to cefepime and vancomycin.  Clinically appears volume overloaded, avoid additional IVF for now.

## 2023-11-13 NOTE — ED Provider Notes (Signed)
 CRITICAL CARE Performed by: Bethann Berkshire Total critical care time: 40 minutes Critical care time was exclusive of separately billable procedures and treating other patients. Critical care was necessary to treat or prevent imminent or life-threatening deterioration. Critical care was time spent personally by me on the following activities: development of treatment plan with patient and/or surrogate as well as nursing, discussions with consultants, evaluation of patient's response to treatment, examination of patient, obtaining history from patient or surrogate, ordering and performing treatments and interventions, ordering and review of laboratory studies, ordering and review of radiographic studies, pulse oximetry and re-evaluation of patient's condition.    Bethann Berkshire, MD 11/13/23 2207

## 2023-11-13 NOTE — ED Provider Notes (Signed)
 [place] EMERGENCY DEPARTMENT AT [facility] Provider Note   CSN: [id number] Arrival date & time: 11/13/23  1213     History  Chief Complaint  Patient presents with   Shortness of Breath    Chad Glass is a 53 y.o. male.   Shortness of Breath Patient denies any shortness of breath.  Began around a week ago per patient.  States all house is sick with similar symptoms.  Has had a cough with not much sputum production.  Has increasing swelling on his legs and abdomen.  States has had some vomiting with some blood and some blood in the stool.  More short of breath.  Hypoxic on arrival.  Not on oxygen at baseline.  Have history of COPD.    Past Medical History:  Diagnosis Date   Anxiety    Chronic back pain    Chronic neck pain    Chronic obstructive pulmonary disease, unspecified (HCC)    Depression    Gastro-esophageal reflux disease with esophagitis    GERD (gastroesophageal reflux disease)    Low back pain    Major depressive disorder, single episode, unspecified    Other hemorrhoids    Sleep apnea    CiPaP at night   Sleep apnea, unspecified     Home Medications Prior to Admission medications   Medication Sig Start Date End Date Taking? Authorizing Provider  oxyCODONE (ROXICODONE) 15 MG immediate release tablet Take 15 mg by mouth 4 (four) times daily as needed for pain.   Yes [provider]  pregabalin (LYRICA) 100 MG capsule Take 100 mg by mouth daily. 11/09/23  Yes [provider]  VENTOLIN HFA 108 (90 Base) MCG/ACT inhaler Inhale 1 puff into the lungs every 6 (six) hours as needed for wheezing or shortness of breath. 10/22/23  Yes [provider]  cetirizine (ZYRTEC) 10 MG tablet Take 10 mg by mouth daily.    [provider]  clonazePAM (KLONOPIN) 0.5 MG tablet Take 0.5 mg by mouth 2 (two) times daily as needed for anxiety. Patient not taking: Reported on 11/13/2023 04/06/22   [provider]   cyclobenzaprine (FLEXERIL) 5 MG tablet Take 5 mg by mouth 3 (three) times daily as needed for muscle spasms.    [provider]  DULoxetine (CYMBALTA) 30 MG capsule Take 90 mg by mouth daily.    [provider]  hydrOXYzine (ATARAX) 25 MG tablet Take 25 mg by mouth 2 (two) times daily. Patient not taking: Reported on 11/13/2023 09/07/23   [provider]  pantoprazole (PROTONIX) 40 MG tablet Take 1 tablet (40 mg total) by mouth 2 (two) times daily before a meal. 06/01/22   Aida Raider, NP  propranolol ER (INDERAL LA) 120 MG 24 hr capsule Take 120 mg by mouth daily. 07/07/22   [provider]  sertraline (ZOLOFT) 50 MG tablet Take 50 mg by mouth daily. 01/23/22   [provider]  Sod Picosulfate-Mag Ox-Cit Acd (CLENPIQ) 10-3.5-12 MG-GM -GM/175ML SOLN Take 1 kit by mouth as directed. 06/22/22   Rourk, Gerrit Friends, MD  XYOSTED 75 MG/0.5ML SOAJ Inject 75 mg into the skin every 7 (seven) days. Patient not taking: Reported on 11/13/2023 01/26/22   [provider]      Allergies    Patient has no known allergies.    Review of Systems   Review of Systems  Respiratory:  Positive for shortness of breath.     Physical Exam Updated Vital Signs BP  112/65   Pulse (!) 114   Temp 98.3 F (36.8 C) (Oral)   Resp (!) 25   Ht 6' (1.829 m)   Wt 127 kg   SpO2 91%   BMI 37.97 kg/m  Physical Exam Vitals and nursing note reviewed.  Cardiovascular:     Rate and Rhythm: Regular rhythm. Tachycardia present.  Pulmonary:     Comments: Diffuse harsh breath sounds. Chest:     Chest wall: No tenderness.  Abdominal:     Comments: Edema on abdomen with area of bruising to right mid to upper abdomen.  Musculoskeletal:     Right lower leg: Edema present.     Left lower leg: Edema present.     Comments: Moderate edema bilateral lower extremities.  Neurological:     Mental Status: He is alert and oriented to person, place, and time.     ED Results /  Procedures / Treatments   Labs (all labs ordered are listed, but only abnormal results are displayed) Labs Reviewed  LACTIC ACID, PLASMA - Abnormal; Notable for the following components:      Result Value   Lactic Acid, Venous 4.6 (*)    All other components within normal limits  COMPREHENSIVE METABOLIC PANEL - Abnormal; Notable for the following components:   Potassium 2.6 (*)    Glucose, Bld 122 (*)    Creatinine, Ser 1.36 (*)    Calcium 7.7 (*)    Total Protein 6.0 (*)    Albumin 2.3 (*)    AST 85 (*)    Total Bilirubin 4.4 (*)    All other components within normal limits  CBC WITH DIFFERENTIAL/PLATELET - Abnormal; Notable for the following components:   WBC 12.3 (*)    RBC 2.65 (*)    Hemoglobin 8.1 (*)    HCT 25.0 (*)    RDW 18.0 (*)    Neutro Abs 8.1 (*)    Monocytes Absolute 1.6 (*)    Abs Immature Granulocytes 0.12 (*)    All other components within normal limits  PROTIME-INR - Abnormal; Notable for the following components:   Prothrombin Time 18.8 (*)    INR 1.6 (*)    All other components within normal limits  RESP PANEL BY RT-PCR (RSV, FLU A&B, COVID)  RVPGX2  CULTURE, BLOOD (ROUTINE X 2)  CULTURE, BLOOD (ROUTINE X 2)  RESPIRATORY PANEL BY PCR  BRAIN NATRIURETIC PEPTIDE  MAGNESIUM  ETHANOL  LACTIC ACID, PLASMA  HIV ANTIBODY (ROUTINE TESTING W REFLEX)  RAPID URINE DRUG SCREEN, HOSP PERFORMED  STREP PNEUMONIAE URINARY ANTIGEN  LEGIONELLA PNEUMOPHILA SEROGP 1 UR AG  POC OCCULT BLOOD, ED  TYPE AND SCREEN  PREPARE RBC (CROSSMATCH)  ABO/RH    EKG EKG Interpretation Date/Time:  Saturday November 13 2023 12:34:41 EST Ventricular Rate:  122 PR Interval:  97 QRS Duration:  93 QT Interval:  357 QTC Calculation: 509 R Axis:   127  Text Interpretation: Sinus tachycardia Right ventricular hypertrophy Prolonged QT interval Confirmed by Benjiman Core [phone]) on 11/13/2023 1:55:32 PM  Radiology DG Chest 2 View Result Date: 11/13/2023 CLINICAL DATA:  Four  day history of worsening shortness of breath EXAM: CHEST - 2 VIEW COMPARISON:  None Available. FINDINGS: Normal lung volumes. Left lower lobe opacity. No pleural effusion or pneumothorax. The heart size and mediastinal contours are within normal limits. No acute osseous abnormality. IMPRESSION: Left lower lobe opacity, suspicious for pneumonia. Electronically Signed   By: Agustin Cree M.D.   On: 11/13/2023 13:30  Procedures Procedures    Medications Ordered in ED Medications  pantoprazole (PROTONIX) injection 40 mg (40 mg Intravenous Given 11/13/23 1412)    Followed by  pantoprazole (PROTONIX) injection 40 mg (has no administration in time range)  0.9 %  sodium chloride infusion (Manually program via Guardrails IV Fluids) (has no administration in time range)  azithromycin (ZITHROMAX) 500 mg in sodium chloride 0.9 % 250 mL IVPB (500 mg Intravenous New Bag/Given 11/13/23 1418)  methylPREDNISolone sodium succinate (SOLU-MEDROL) 125 mg/2 mL injection 125 mg (has no administration in time range)  ipratropium-albuterol (DUONEB) 0.5-2.5 (3) MG/3ML nebulizer solution 3 mL (has no administration in time range)  furosemide (LASIX) injection 40 mg (has no administration in time range)  potassium chloride SA (KLOR-CON M) CR tablet 40 mEq (has no administration in time range)  cefTRIAXone (ROCEPHIN) 2 g in sodium chloride 0.9 % 100 mL IVPB (2 g Intravenous New Bag/Given 11/13/23 1409)    ED Course/ Medical Decision Making/ A&P                                 Medical Decision Making Amount and/or Complexity of Data Reviewed Labs: ordered. Radiology: ordered.  Risk Prescription drug management. Decision regarding hospitalization.   Patient shortness of breath tachycardia hypoxia.  Differential diagnoses long but includes causes such as pneumonia CHF viral syndrome. However also reportedly been throwing up some blood and blood in stool.  Found to be anemic.  Differential diagnosis does include GI  bleed.  Not on blood thinners.  Also has edema moderate on legs and abdomen.  Has an anemia.  Did have hematemesis here.  Will start on Protonix IV.  Chest x-ray showed pneumonia.  Will give antibiotics.  BNP is normal.  Blood pressure has maintained mildly low but not significantly hypotensive.  Discussed with Dr. Jena Gauss from GI.  Patient could probably have endoscopy in a couple days here but if patient worsens at all would require a higher level of care.  For now we will transfuse 1 unit.  Patient accepted blood.  Will admit to hospitalist.  CRITICAL CARE Performed by: Benjiman Core Total critical care time: 30 minutes Critical care time was exclusive of separately billable procedures and treating other patients. Critical care was necessary to treat or prevent imminent or life-threatening deterioration. Critical care was time spent personally by me on the following activities: development of treatment plan with patient and/or surrogate as well as nursing, discussions with consultants, evaluation of patient's response to treatment, examination of patient, obtaining history from patient or surrogate, ordering and performing treatments and interventions, ordering and review of laboratory studies, ordering and review of radiographic studies, pulse oximetry and re-evaluation of patient's condition.            Final Clinical Impression(s) / ED Diagnoses Final diagnoses:  Community acquired pneumonia, unspecified laterality  Peripheral edema  Upper GI bleed  Anemia, unspecified type  Respiratory failure with hypoxia, unspecified chronicity (HCC)    Rx / DC Orders ED Discharge Orders     None         Benjiman Core, MD 11/13/23 1504

## 2023-11-13 NOTE — ED Notes (Signed)
 Cannot place patient on o2 he keeps yanking it off of his face.

## 2023-11-13 NOTE — ED Notes (Signed)
 Patient gotten up and placed on bedside commode and had a BM with a 2 person assist, patient was very unsteady on his feet, Patients BM was black and congealed.

## 2023-11-13 NOTE — ED Triage Notes (Signed)
 Pt c/o increased SOB starting 4 days ago and getting worse. Pt states hx of COPD. Pt states whole house is sick with something but unsure what. Pt states fevers, n/v/d. Pt states multiple episodes of diarrhea. Pt states while throwing noticed blood color, and blood in stool.

## 2023-11-13 NOTE — ED Notes (Signed)
 ET tube pulled back to 23-24

## 2023-11-13 NOTE — ED Notes (Signed)
 Admitting MD made aware of need for reassessment, enroute to see pt

## 2023-11-13 NOTE — H&P (Incomplete)
 NAME:  Chad Glass, MRN:  [id number], DOB:  Dec 03, 1970, LOS: 1 ADMISSION DATE:  11/13/2023, CONSULTATION DATE:  11/13/23 REFERRING MD:  [facility] CHIEF COMPLAINT:  shortness of breath    History of Present Illness:  53 year old male with past medical history of MDD, GERD, COPD, OSA on CPAP at night, tobacco use who initially presented to [facility] emergency department on 2/22 with shortness of breath, hematemesis, melena. Additionally, having increased abdominal and leg swelling. Noted to be hypoxic on arrival. Lab work up notable for negative resp panel, BNP 97, K 2.6, sCr 1.36, AST 85, Tbili 4.4, Hgb 8.1, WBC 12.3, plt 166, lactic 4.6. CXR with LLL opacity c/w pneumonia. EDP notes hematemesis in ED and started on protonix IV. Given rocephin, azithro for CAP coverage. GI was consulted who recommended likely needing transfer to [facility]. Transfused 1U PRBC. He was initially admitted to [facility]. While admitted, had worsening lactic acidosis, becoming altered and agitated. They attempted BiPAP but he was too agitated. Attempted haldol, ativan, benadryl with no improvement and patient was intubated. Transferred to [facility] for ICU admission. Prior to transfer, patient had ongoing hypotension for about 2-3 hours with escalating pressor requirements. EDP placed a- line, and ordered IVF resuscitation.   Pertinent  Medical History  MDD, GERD, COPD, OSA on CPAP at night, tobacco use  Significant Hospital Events: Including procedures, antibiotic start and stop dates in addition to other pertinent events   2/22: [facility] ED for SOB, melena, hematemesis. 1UPRBC. Admit to [facility]. Agitation, worsening lactic>intubated>transfer to [facility] ICU  Interim History / Subjective:  Arrived with ongoing escalating pressor requirements, vent dysynchrony, abdomen is protuberant, ongoing GIB through OGT. MTP ordered, GI consulted for emergent endoscopy - will come evaluate. CVC placement.   Objective   Blood pressure (!) 106/48, pulse (!) 112,  temperature 97.6 F (36.4 C), temperature source Oral, resp. rate (!) 30, height 6' 0.01" (1.829 m), weight 134 kg, SpO2 91%.    Vent Mode: PRVC FiO2 (%):  [50 %-100 %] 50 % Set Rate:  [22 bmp] 22 bmp Vt Set:  [782 mL] 620 mL PEEP:  [5 cmH20] 5 cmH20 Plateau Pressure:  [24 cmH20] 24 cmH20   Intake/Output Summary (Last 24 hours) at 11/02/2023 0259 Last data filed at 10/30/2023 0138 Gross per 24 hour  Intake 1057.24 ml  Output --  Net 1057.24 ml   Filed Weights   11/13/23 1228 11/10/2023 0245  Weight: 127 kg 134 kg    Examination: General: obese male, intubated, sedated, critically ill appearing  HENT: PERRLA, conjunctival edema, anicteric sclera, ETT, OGT w/ bloody to coffee ground output Lungs: vented, rhonchi right upper lung field, diminished BLL Cardiovascular: s1s2, tachycardia, no appreciable murmur, rub, gallop Abdomen: rounded, soft, non-distended  Extremities: 1-2+ pitting edema BLE to the knee  Neuro: sedated  GU: foley  Resolved Hospital Problem list    Assessment & Plan:  Shock 2/2 severe sepsis, hemorrhagic  Lactic acidosis  Progressive decline during ED stay to admission. Admit for sepsis 2/2 severe CAP. Hypoxia, agitation and subsequent intubation. While at [facility] had hours of hypotension requiring uptitration of vasopressors. Additionally, worsening lactic acidosis, now >9. Mix of septic shock and hemorrhagic shock 2/2 hematemesis/melena. He does appear FVO on exam though. No documented heart failure. Fluid resuscitated PTA.  - trend lactic, fever, wbc curve - f/u procal, tracheal aspirate  - azithromycin, cefepime, vanc  - + solumedrol 40 daily  - f/u echo  - titrate levo, vaso, neo to MAP >65  Acute hypoxic respiratory failure 2/2 pneumonia  OSA on CPAP nightly  COPD  Tobacco use  Shortness of breath, cough x days. Progressively worse. Sick contacts. Resp panel and RVP engative. Legionella and strep pna pending. Progressive decompensation with agitation,  hypoxia. Did not tolerate BiPAP for agitation and intubated. CT chest with complete consolidation of LLL, RUL and lingula atelecatasis.  - azithromycin, cefepime, vanc for severe CAP  - duoneb q4h  - f/u tracheal aspirate  - full mechanical vent support - lung protective ventilation 6-8cc/kg Vt - VAP and PAD bundle in place  - titrate FiO2 to sat goal >92  - maintain peak/plats <30, driving pressures <08    Acute GI Bleed  Acute blood loss anemia  Charted that he used to drink daily. No alcohol x1 year. Unclear if he uses NSAIDs, ASA or goody powders or has history of AVM etc. Not anticoagulated. No history of colonoscopy or endoscopy. Presenting with hematemesis and melena for 4 days. Initial Hgb 8.1 and transfused 1 UPRBC for hematemesis in ED. Plt okay @ 190. INR 1.6.  - STAT repeat CBC now then q6h  - type and screen STAT - consulting GI for likely emergent endoscopy  - MTP ordered with worsening shock and dark blood from OGT  - protonix BID  - octreotide gtt   Acute kidney injury  Baseline ~1.0-1.1. sCr 1.70. Septic ATN and pre-renal azotemia  - trend bmp, mag, phos - replete elytes - strict I&O - f/u urine studies - Avoid nephrotoxic agents, renally dose medications - ensure adequate renal perfusion   Ascites  Elevated transaminases  Do not see any dedicated abdominal imaging in recent history. History of alcohol use. ?cirrhosis. Does have decent ascites on ultrasound exam at bedside. In setting of septic shock will diagnostic tap to r/o SBP. - paracentesis now  - f/u labs  - antibiotics as above   Hypokalemia  - replete  - trend   GERD - ppi   Best Practice (right click and "Reselect all SmartList Selections" daily)   Diet/type: NPO DVT prophylaxis: SCD Pressure ulcer(s): na GI prophylaxis: PPI Lines: Central line, Arterial Line, and yes and it is still needed Foley:  Yes, and it is still needed Code Status:  full code Last date of multidisciplinary goals of  care discussion [pending]  Labs   CBC: Recent Labs  Lab 11/13/23 1239 11/13/23 2143  WBC 12.3* 20.9*  NEUTROABS 8.1*  --   HGB 8.1* 8.2*  HCT 25.0* 26.4*  MCV 94.3 99.6  PLT 166 190    Basic Metabolic Panel: Recent Labs  Lab 11/13/23 1239 11/13/23 1358 11/13/23 2143  NA 141  --  144  K 2.6*  --  3.0*  CL 107  --  108  CO2 22  --  14*  GLUCOSE 122*  --  130*  BUN 20  --  28*  CREATININE 1.36*  --  1.70*  CALCIUM 7.7*  --  7.4*  MG  --  1.8  --    GFR: Estimated Creatinine Clearance: 71.2 mL/min (A) (by C-G formula based on SCr of 1.7 mg/dL (H)). Recent Labs  Lab 11/13/23 1239 11/13/23 1333 11/13/23 1629 11/13/23 2143 11/13/23 2319  WBC 12.3*  --   --  20.9*  --   LATICACIDVEN  --  4.6* 5.3*  --  >9.0*    Liver Function Tests: Recent Labs  Lab 11/13/23 1239  AST 85*  ALT 22  ALKPHOS 87  BILITOT 4.4*  PROT  6.0*  ALBUMIN 2.3*   No results for input(s): "LIPASE", "AMYLASE" in the last 168 hours. No results for input(s): "AMMONIA" in the last 168 hours.  ABG    Component Value Date/Time   PHART 7.23 (L) 11/13/2023 2127   PCO2ART 43 11/13/2023 2127   PO2ART 105 11/13/2023 2127   HCO3 18.0 (L) 11/13/2023 2127   ACIDBASEDEF 9.2 (H) 11/13/2023 2127   O2SAT 99 11/13/2023 2127     Coagulation Profile: Recent Labs  Lab 11/13/23 1338  INR 1.6*    Cardiac Enzymes: No results for input(s): "CKTOTAL", "CKMB", "CKMBINDEX", "TROPONINI" in the last 168 hours.  HbA1C: Hemoglobin A1C  Date/Time Value Ref Range Status  02/07/2016 12:00 AM 5.8  Final    CBG: Recent Labs  Lab 11/13/23 1938 10/26/2023 0246  GLUCAP 94 102*    Review of Systems:   As above  Past Medical History:  He,  has a past medical history of Anxiety, Chronic back pain, Chronic neck pain, Chronic obstructive pulmonary disease, unspecified (HCC), Depression, Gastro-esophageal reflux disease with esophagitis, GERD (gastroesophageal reflux disease), Low back pain, Major depressive  disorder, single episode, unspecified, Other hemorrhoids, Sleep apnea, and Sleep apnea, unspecified.   Surgical History:   Past Surgical History:  Procedure Laterality Date   KNEE SURGERY     right-cut around the cartilage. Dr. Romeo Apple at [facility]   UMBILICAL HERNIA REPAIR  11/30/2011   Procedure: HERNIA REPAIR UMBILICAL ADULT;  Surgeon: Fabio Bering, MD;  Location: [facility]S;  Service: General;  Laterality: N/A;     Social History:   reports that he has been smoking cigarettes. He has a 22.5 pack-year smoking history. He does not have any smokeless tobacco history on file. He reports that he does not drink alcohol and does not use drugs.   Family History:  His family history includes Arthritis in an other family member; Breast cancer in his mother; Diabetes in an other family member. There is no history of Anesthesia problems, Hypotension, Pseudochol deficiency, or Malignant hyperthermia.   Allergies No Known Allergies   Home Medications  Prior to Admission medications   Medication Sig Start Date End Date Taking? Authorizing Provider  oxyCODONE (ROXICODONE) 15 MG immediate release tablet Take 15 mg by mouth 4 (four) times daily as needed for pain.   Yes [provider]  pregabalin (LYRICA) 100 MG capsule Take 100 mg by mouth daily. 11/09/23  Yes [provider]  VENTOLIN HFA 108 (90 Base) MCG/ACT inhaler Inhale 1 puff into the lungs every 6 (six) hours as needed for wheezing or shortness of breath. 10/22/23  Yes [provider]  cetirizine (ZYRTEC) 10 MG tablet Take 10 mg by mouth daily.    [provider]  clonazePAM (KLONOPIN) 0.5 MG tablet Take 0.5 mg by mouth 2 (two) times daily as needed for anxiety. Patient not taking: Reported on 11/13/2023 04/06/22   [provider]  cyclobenzaprine (FLEXERIL) 5 MG tablet Take 5 mg by mouth 3 (three) times daily as needed for muscle spasms.    [provider]  DULoxetine (CYMBALTA)  30 MG capsule Take 90 mg by mouth daily.    [provider]  hydrOXYzine (ATARAX) 25 MG tablet Take 25 mg by mouth 2 (two) times daily. Patient not taking: Reported on 11/13/2023 09/07/23   [provider]  pantoprazole (PROTONIX) 40 MG tablet Take 1 tablet (40 mg total) by mouth 2 (two) times daily before a meal. 06/01/22   Mahon, Frederik Schmidt,  NP  propranolol ER (INDERAL LA) 120 MG 24 hr capsule Take 120 mg by mouth daily. 07/07/22   [provider]  sertraline (ZOLOFT) 50 MG tablet Take 50 mg by mouth daily. 01/23/22   [provider]  Sod Picosulfate-Mag Ox-Cit Acd (CLENPIQ) 10-3.5-12 MG-GM -GM/175ML SOLN Take 1 kit by mouth as directed. 06/22/22   Rourk, Gerrit Friends, MD  XYOSTED 75 MG/0.5ML SOAJ Inject 75 mg into the skin every 7 (seven) days. Patient not taking: Reported on 11/13/2023 01/26/22   [provider]     Critical care time: 75   Lenard Galloway [facility] 11/08/2023 2:59 AM  Please see [email or web address] for pager details.  From 7A-7P if no response, please call [phone] After hours, please call ELink [phone]

## 2023-11-13 NOTE — Progress Notes (Signed)
 Pharmacy Antibiotic Note  Chad Glass is a 53 y.o. male admitted on 11/13/2023 with sepsis.  Pharmacy has been consulted for vancomycin and cefepime dosing.  Today, 11/13/23 Lactic acid 4.6>5.3 WBC 12.3 Scr 1.36 (likely baseline, unclear) Afebrile Resp panel negative CXR: Left lower lobe opacity, suspicious for pneumonia.    Plan: Give vancomycin 2500 mg IV x1 followed by 2000 mg IV Q24H. Goal AUC 400-550. Expected AUC: 511.1 Expected Css min: 10.5 SCr used: 1.36  Weight used: IBW, Vd used: 0.5 (BMI 37.97) Start Cefepime 2 g IV Q8H Continue to monitor renal function and follow culture results   Height: 6' (182.9 cm) Weight: 127 kg (279 lb 15.8 oz) IBW/kg (Calculated) : 77.6  Temp (24hrs), Avg:98.9 F (37.2 C), Min:98.3 F (36.8 C), Max:99.3 F (37.4 C)  Recent Labs  Lab 11/13/23 1239 11/13/23 1333 11/13/23 1629  WBC 12.3*  --   --   CREATININE 1.36*  --   --   LATICACIDVEN  --  4.6* 5.3*    Estimated Creatinine Clearance: 86.5 mL/min (A) (by C-G formula based on SCr of 1.36 mg/dL (H)).    No Known Allergies  Antimicrobials this admission: 2/22 Azithro and ceftriaxone x 1 2/22 Vanc >>  2/22 Cefepime>>  Microbiology results: 2/22 BCx: IP   Thank you for allowing pharmacy to be a part of this patient's care.  Merryl Hacker, PharmD Clinical Pharmacist  11/13/2023 5:26 PM

## 2023-11-13 NOTE — ED Notes (Signed)
 Tube retracted to 22 cm at lip, FiO2 50

## 2023-11-13 NOTE — ED Notes (Signed)
 Pt restless,  tachypneic; Dr.Zammit made aware and to bedside. Admitting provider paged

## 2023-11-13 NOTE — ED Notes (Signed)
 Patient just got out of bed and was a little combative, he ripped his oxygen off and he took off his chest leads and took off out of his bed and was trying to escape the ED. Patient seems to be altered. EDP and admitting provider notified. ABG ordered and soft restraints given verbal order.

## 2023-11-14 ENCOUNTER — Inpatient Hospital Stay (HOSPITAL_COMMUNITY): Payer: Medicaid Other

## 2023-11-14 ENCOUNTER — Encounter (HOSPITAL_COMMUNITY): Admission: EM | Disposition: E | Payer: Self-pay | Source: Home / Self Care | Attending: Internal Medicine

## 2023-11-14 ENCOUNTER — Encounter (HOSPITAL_COMMUNITY): Payer: Self-pay | Admitting: Anesthesiology

## 2023-11-14 DIAGNOSIS — K921 Melena: Secondary | ICD-10-CM | POA: Diagnosis not present

## 2023-11-14 DIAGNOSIS — T182XXA Foreign body in stomach, initial encounter: Secondary | ICD-10-CM

## 2023-11-14 DIAGNOSIS — R579 Shock, unspecified: Secondary | ICD-10-CM

## 2023-11-14 DIAGNOSIS — D649 Anemia, unspecified: Secondary | ICD-10-CM

## 2023-11-14 DIAGNOSIS — J9601 Acute respiratory failure with hypoxia: Secondary | ICD-10-CM

## 2023-11-14 DIAGNOSIS — I8501 Esophageal varices with bleeding: Secondary | ICD-10-CM

## 2023-11-14 DIAGNOSIS — K922 Gastrointestinal hemorrhage, unspecified: Secondary | ICD-10-CM | POA: Diagnosis not present

## 2023-11-14 DIAGNOSIS — K920 Hematemesis: Secondary | ICD-10-CM | POA: Diagnosis not present

## 2023-11-14 DIAGNOSIS — J441 Chronic obstructive pulmonary disease with (acute) exacerbation: Secondary | ICD-10-CM | POA: Diagnosis not present

## 2023-11-14 HISTORY — PX: ESOPHAGEAL BANDING: SHX5518

## 2023-11-14 HISTORY — PX: ESOPHAGOGASTRODUODENOSCOPY (EGD) WITH PROPOFOL: SHX5813

## 2023-11-14 HISTORY — PX: FOREIGN BODY REMOVAL: SHX962

## 2023-11-14 LAB — POCT I-STAT 7, (LYTES, BLD GAS, ICA,H+H)
Acid-base deficit: 14 mmol/L — ABNORMAL HIGH (ref 0.0–2.0)
Acid-base deficit: 10 mmol/L — ABNORMAL HIGH (ref 0.0–2.0)
Acid-base deficit: 8 mmol/L — ABNORMAL HIGH (ref 0.0–2.0)
Bicarbonate: 13.6 mmol/L — ABNORMAL LOW (ref 20.0–28.0)
Bicarbonate: 18 mmol/L — ABNORMAL LOW (ref 20.0–28.0)
Bicarbonate: 18.4 mmol/L — ABNORMAL LOW (ref 20.0–28.0)
Calcium, Ion: 0.91 mmol/L — ABNORMAL LOW (ref 1.15–1.40)
Calcium, Ion: 0.88 mmol/L — CL (ref 1.15–1.40)
Calcium, Ion: 0.92 mmol/L — ABNORMAL LOW (ref 1.15–1.40)
HCT: 23 % — ABNORMAL LOW (ref 39.0–52.0)
HCT: 26 % — ABNORMAL LOW (ref 39.0–52.0)
HCT: 25 % — ABNORMAL LOW (ref 39.0–52.0)
Hemoglobin: 7.8 g/dL — ABNORMAL LOW (ref 13.0–17.0)
Hemoglobin: 8.8 g/dL — ABNORMAL LOW (ref 13.0–17.0)
Hemoglobin: 8.5 g/dL — ABNORMAL LOW (ref 13.0–17.0)
O2 Saturation: 99 %
O2 Saturation: 92 %
O2 Saturation: 95 %
Patient temperature: 97.6
Patient temperature: 96.1
Patient temperature: 96.3
Potassium: 3.2 mmol/L — ABNORMAL LOW (ref 3.5–5.1)
Potassium: 3.7 mmol/L (ref 3.5–5.1)
Potassium: 3.3 mmol/L — ABNORMAL LOW (ref 3.5–5.1)
Sodium: 144 mmol/L (ref 135–145)
Sodium: 144 mmol/L (ref 135–145)
Sodium: 143 mmol/L (ref 135–145)
TCO2: 15 mmol/L — ABNORMAL LOW (ref 22–32)
TCO2: 19 mmol/L — ABNORMAL LOW (ref 22–32)
TCO2: 20 mmol/L — ABNORMAL LOW (ref 22–32)
pCO2 arterial: 37.1 mmHg (ref 32–48)
pCO2 arterial: 45.4 mmHg (ref 32–48)
pCO2 arterial: 36 mmHg (ref 32–48)
pH, Arterial: 7.168 — CL (ref 7.35–7.45)
pH, Arterial: 7.197 — CL (ref 7.35–7.45)
pH, Arterial: 7.31 — ABNORMAL LOW (ref 7.35–7.45)
pO2, Arterial: 162 mmHg — ABNORMAL HIGH (ref 83–108)
pO2, Arterial: 74 mmHg — ABNORMAL LOW (ref 83–108)
pO2, Arterial: 79 mmHg — ABNORMAL LOW (ref 83–108)

## 2023-11-14 LAB — BPAM RBC
Blood Product Expiration Date: 202503172359
ISSUE DATE / TIME: 202502221705
Unit Type and Rh: 6200

## 2023-11-14 LAB — BASIC METABOLIC PANEL
Anion gap: 23 — ABNORMAL HIGH (ref 5–15)
BUN: 29 mg/dL — ABNORMAL HIGH (ref 6–20)
CO2: 16 mmol/L — ABNORMAL LOW (ref 22–32)
Calcium: 7.1 mg/dL — ABNORMAL LOW (ref 8.9–10.3)
Chloride: 104 mmol/L (ref 98–111)
Creatinine, Ser: 1.9 mg/dL — ABNORMAL HIGH (ref 0.61–1.24)
GFR, Estimated: 42 mL/min — ABNORMAL LOW (ref >60)
Glucose, Bld: 180 mg/dL — ABNORMAL HIGH (ref 70–99)
Potassium: 3.6 mmol/L (ref 3.5–5.1)
Sodium: 143 mmol/L (ref 135–145)

## 2023-11-14 LAB — TYPE AND SCREEN
ABO/RH(D): A POS
Antibody Screen: NEGATIVE
Unit division: 0

## 2023-11-14 LAB — GLUCOSE, CAPILLARY
Glucose-Capillary: 102 mg/dL — ABNORMAL HIGH (ref 70–99)
Glucose-Capillary: 146 mg/dL — ABNORMAL HIGH (ref 70–99)
Glucose-Capillary: 174 mg/dL — ABNORMAL HIGH (ref 70–99)

## 2023-11-14 LAB — CBC
HCT: 24.5 % — ABNORMAL LOW (ref 39.0–52.0)
HCT: 26.2 % — ABNORMAL LOW (ref 39.0–52.0)
HCT: 25.6 % — ABNORMAL LOW (ref 39.0–52.0)
Hemoglobin: 7.8 g/dL — ABNORMAL LOW (ref 13.0–17.0)
Hemoglobin: 8.3 g/dL — ABNORMAL LOW (ref 13.0–17.0)
Hemoglobin: 8.3 g/dL — ABNORMAL LOW (ref 13.0–17.0)
MCH: 31.3 pg (ref 26.0–34.0)
MCH: 30.9 pg (ref 26.0–34.0)
MCH: 30.9 pg (ref 26.0–34.0)
MCHC: 31.8 g/dL (ref 30.0–36.0)
MCHC: 31.7 g/dL (ref 30.0–36.0)
MCHC: 32.4 g/dL (ref 30.0–36.0)
MCV: 98.4 fL (ref 80.0–100.0)
MCV: 97.4 fL (ref 80.0–100.0)
MCV: 95.2 fL (ref 80.0–100.0)
Platelets: 214 10*3/uL (ref 150–400)
Platelets: 242 10*3/uL (ref 150–400)
Platelets: 239 10*3/uL (ref 150–400)
RBC: 2.49 MIL/uL — ABNORMAL LOW (ref 4.22–5.81)
RBC: 2.69 MIL/uL — ABNORMAL LOW (ref 4.22–5.81)
RBC: 2.69 MIL/uL — ABNORMAL LOW (ref 4.22–5.81)
RDW: 19 % — ABNORMAL HIGH (ref 11.5–15.5)
RDW: 19.9 % — ABNORMAL HIGH (ref 11.5–15.5)
RDW: 20.3 % — ABNORMAL HIGH (ref 11.5–15.5)
WBC: 30.4 10*3/uL — ABNORMAL HIGH (ref 4.0–10.5)
WBC: 36.6 10*3/uL — ABNORMAL HIGH (ref 4.0–10.5)
WBC: 38.4 10*3/uL — ABNORMAL HIGH (ref 4.0–10.5)
nRBC: 0.1 % (ref 0.0–0.2)
nRBC: 0.1 % (ref 0.0–0.2)
nRBC: 0.1 % (ref 0.0–0.2)

## 2023-11-14 LAB — COMPREHENSIVE METABOLIC PANEL
ALT: 28 U/L (ref 0–44)
AST: 129 U/L — ABNORMAL HIGH (ref 15–41)
Albumin: 1.9 g/dL — ABNORMAL LOW (ref 3.5–5.0)
Alkaline Phosphatase: 70 U/L (ref 38–126)
Anion gap: 28 — ABNORMAL HIGH (ref 5–15)
BUN: 27 mg/dL — ABNORMAL HIGH (ref 6–20)
CO2: 16 mmol/L — ABNORMAL LOW (ref 22–32)
Calcium: 7.2 mg/dL — ABNORMAL LOW (ref 8.9–10.3)
Chloride: 103 mmol/L (ref 98–111)
Creatinine, Ser: 2.03 mg/dL — ABNORMAL HIGH (ref 0.61–1.24)
GFR, Estimated: 38 mL/min — ABNORMAL LOW (ref >60)
Glucose, Bld: 104 mg/dL — ABNORMAL HIGH (ref 70–99)
Potassium: 3.1 mmol/L — ABNORMAL LOW (ref 3.5–5.1)
Sodium: 147 mmol/L — ABNORMAL HIGH (ref 135–145)
Total Bilirubin: 4 mg/dL — ABNORMAL HIGH (ref 0.0–1.2)
Total Protein: 5.1 g/dL — ABNORMAL LOW (ref 6.5–8.1)

## 2023-11-14 LAB — LACTIC ACID, PLASMA
Lactic Acid, Venous: >9 mmol/L (ref 0.5–1.9)
Lactic Acid, Venous: >9 mmol/L (ref 0.5–1.9)
Lactic Acid, Venous: >9 mmol/L (ref 0.5–1.9)
Lactic Acid, Venous: >9 mmol/L (ref 0.5–1.9)

## 2023-11-14 LAB — ECHOCARDIOGRAM COMPLETE
Area-P 1/2: 6.12 cm2
Height: 72.008 in
S' Lateral: 3.3 cm
Weight: 4726.66 [oz_av]

## 2023-11-14 LAB — HEMOGLOBIN A1C
Hgb A1c MFr Bld: 5.6 % (ref 4.8–5.6)
Mean Plasma Glucose: 114.02 mg/dL

## 2023-11-14 LAB — PREPARE RBC (CROSSMATCH)

## 2023-11-14 LAB — TRIGLYCERIDES: Triglycerides: 143 mg/dL (ref <150)

## 2023-11-14 LAB — STREP PNEUMONIAE URINARY ANTIGEN: Strep Pneumo Urinary Antigen: NEGATIVE

## 2023-11-14 SURGERY — ESOPHAGOGASTRODUODENOSCOPY (EGD) WITH PROPOFOL
Anesthesia: Moderate Sedation

## 2023-11-14 MED ORDER — FENTANYL BOLUS VIA INFUSION
100.0000 ug | INTRAVENOUS | Status: DC | PRN
  Administered 2023-11-14 (×3): 100 ug via INTRAVENOUS

## 2023-11-14 MED ORDER — SODIUM BICARBONATE 8.4 % IV SOLN: 100.0000 meq | INTRAVENOUS | Status: AC

## 2023-11-14 MED ORDER — NOREPINEPHRINE 16 MG/250ML-% IV SOLN
0.0000 ug/min | INTRAVENOUS | Status: DC
  Administered 2023-11-14: 25 ug/min via INTRAVENOUS
  Administered 2023-11-14: 40 ug/min via INTRAVENOUS
  Filled 2023-11-14 (×3): qty 250

## 2023-11-14 MED ORDER — EPINEPHRINE HCL 5 MG/250ML IV SOLN IN NS
0.5000 ug/min | INTRAVENOUS | Status: DC
  Filled 2023-11-14: qty 250

## 2023-11-14 MED ORDER — MIDAZOLAM BOLUS VIA INFUSION (WITHDRAWAL LIFE SUSTAINING TX)
2.0000 mg | INTRAVENOUS | Status: DC | PRN
  Administered 2023-11-14 (×3): 2 mg via INTRAVENOUS

## 2023-11-14 MED ORDER — MIDAZOLAM HCL 2 MG/2ML IJ SOLN
INTRAMUSCULAR | Status: AC
  Filled 2023-11-14: qty 4

## 2023-11-14 MED ORDER — SODIUM BICARBONATE 8.4 % IV SOLN
INTRAVENOUS | Status: AC
Start: 2023-11-14 — End: 2023-11-14
  Filled 2023-11-14: qty 100

## 2023-11-14 MED ORDER — FENTANYL CITRATE PF 50 MCG/ML IJ SOSY
50.0000 ug | PREFILLED_SYRINGE | Freq: Once | INTRAMUSCULAR | Status: AC
  Administered 2023-11-14: 50 ug via INTRAVENOUS

## 2023-11-14 MED ORDER — POLYETHYLENE GLYCOL 3350 17 G PO PACK: 17.0000 g | PACK | Freq: Every day | ORAL | Status: DC

## 2023-11-14 MED ORDER — FENTANYL 2500MCG IN NS 250ML (10MCG/ML) PREMIX INFUSION
0.0000 ug/h | INTRAVENOUS | Status: DC
  Administered 2023-11-14: 400 ug/h via INTRAVENOUS
  Filled 2023-11-14: qty 250

## 2023-11-14 MED ORDER — SODIUM BICARBONATE 8.4 % IV SOLN
INTRAVENOUS | Status: DC
  Filled 2023-11-14 (×2): qty 150

## 2023-11-14 MED ORDER — CALCIUM GLUCONATE-NACL 2-0.675 GM/100ML-% IV SOLN
2.0000 g | INTRAVENOUS | Status: AC
  Administered 2023-11-14: 2000 mg via INTRAVENOUS
  Filled 2023-11-14: qty 100

## 2023-11-14 MED ORDER — DOCUSATE SODIUM 50 MG/5ML PO LIQD: 100.0000 mg | Freq: Two times a day (BID) | ORAL | Status: DC

## 2023-11-14 MED ORDER — MIDAZOLAM BOLUS VIA INFUSION: 0.0000 mg | INTRAVENOUS | Status: DC | PRN

## 2023-11-14 MED ORDER — SODIUM BICARBONATE 8.4 % IV SOLN
100.0000 meq | Freq: Once | INTRAVENOUS | Status: AC
  Administered 2023-11-14: 100 meq via INTRAVENOUS

## 2023-11-14 MED ORDER — VASOPRESSIN 20 UNITS/100 ML INFUSION FOR SHOCK
0.0400 [IU]/min | INTRAVENOUS | Status: DC
Start: 2023-11-14 — End: 2023-11-15
  Administered 2023-11-14 (×2): 0.04 [IU]/min via INTRAVENOUS
  Filled 2023-11-14 (×2): qty 100

## 2023-11-14 MED ORDER — FENTANYL CITRATE (PF) 100 MCG/2ML IJ SOLN
INTRAMUSCULAR | Status: AC
  Filled 2023-11-14: qty 4

## 2023-11-14 MED ORDER — HYDROCORTISONE SOD SUC (PF) 100 MG IJ SOLR
100.0000 mg | Freq: Three times a day (TID) | INTRAMUSCULAR | Status: DC
  Administered 2023-11-14: 100 mg via INTRAVENOUS
  Filled 2023-11-14: qty 2

## 2023-11-14 MED ORDER — CALCIUM GLUCONATE-NACL 2-0.675 GM/100ML-% IV SOLN
2.0000 g | Freq: Once | INTRAVENOUS | Status: AC
  Administered 2023-11-14: 2000 mg via INTRAVENOUS
  Filled 2023-11-14: qty 100

## 2023-11-14 MED ORDER — GLYCOPYRROLATE 1 MG PO TABS
1.0000 mg | ORAL_TABLET | ORAL | Status: DC | PRN
  Filled 2023-11-14: qty 1

## 2023-11-14 MED ORDER — INSULIN ASPART 100 UNIT/ML IJ SOLN
0.0000 [IU] | INTRAMUSCULAR | Status: DC
  Administered 2023-11-14: 4 [IU] via SUBCUTANEOUS

## 2023-11-14 MED ORDER — FAMOTIDINE 20 MG PO TABS: 20.0000 mg | ORAL_TABLET | Freq: Two times a day (BID) | ORAL | Status: DC

## 2023-11-14 MED ORDER — SODIUM BICARBONATE 8.4 % IV SOLN
INTRAVENOUS | Status: AC
  Administered 2023-11-14: 100 meq via INTRAVENOUS
  Filled 2023-11-14: qty 100

## 2023-11-14 MED ORDER — VANCOMYCIN HCL 1500 MG/300ML IV SOLN
1500.0000 mg | INTRAVENOUS | Status: DC
  Filled 2023-11-14: qty 300

## 2023-11-14 MED ORDER — LACTATED RINGERS IV BOLUS
1000.0000 mL | Freq: Once | INTRAVENOUS | Status: DC
  Administered 2023-11-14: 1000 mL via INTRAVENOUS

## 2023-11-14 MED ORDER — PHENYLEPHRINE CONCENTRATED 100MG/250ML (0.4 MG/ML) INFUSION SIMPLE
25.0000 ug/min | INTRAVENOUS | Status: DC
  Filled 2023-11-14: qty 250

## 2023-11-14 MED ORDER — SODIUM CHLORIDE 0.9 % IV SOLN: INTRAVENOUS | Status: DC

## 2023-11-14 MED ORDER — METHYLPREDNISOLONE SODIUM SUCC 40 MG IJ SOLR: 40.0000 mg | Freq: Every day | INTRAMUSCULAR | Status: DC

## 2023-11-14 MED ORDER — SODIUM CHLORIDE 0.9 % IV SOLN
50.0000 ug/h | INTRAVENOUS | Status: DC
  Administered 2023-11-14 (×2): 50 ug/h via INTRAVENOUS
  Filled 2023-11-14 (×2): qty 1

## 2023-11-14 MED ORDER — SODIUM CHLORIDE 0.9% IV SOLUTION: Freq: Once | INTRAVENOUS | Status: DC

## 2023-11-14 MED ORDER — ORAL CARE MOUTH RINSE: 15.0000 mL | OROMUCOSAL | Status: DC | PRN

## 2023-11-14 MED ORDER — SODIUM CHLORIDE 0.9 % IV SOLN
500.0000 mg | INTRAVENOUS | Status: DC
  Administered 2023-11-14: 500 mg via INTRAVENOUS
  Filled 2023-11-14: qty 5

## 2023-11-14 MED ORDER — FENTANYL 2500MCG IN NS 250ML (10MCG/ML) PREMIX INFUSION
50.0000 ug/h | INTRAVENOUS | Status: DC
Start: 2023-11-14 — End: 2023-11-14
  Administered 2023-11-14: 50 ug/h via INTRAVENOUS
  Filled 2023-11-14: qty 250

## 2023-11-14 MED ORDER — ORAL CARE MOUTH RINSE
15.0000 mL | OROMUCOSAL | Status: DC
  Administered 2023-11-14 (×7): 15 mL via OROMUCOSAL

## 2023-11-14 MED ORDER — CHLORHEXIDINE GLUCONATE CLOTH 2 % EX PADS
6.0000 | MEDICATED_PAD | Freq: Every day | CUTANEOUS | Status: DC
  Administered 2023-11-14: 6 via TOPICAL

## 2023-11-14 MED ORDER — FENTANYL BOLUS VIA INFUSION
50.0000 ug | INTRAVENOUS | Status: DC | PRN
  Administered 2023-11-14: 50 ug via INTRAVENOUS
  Administered 2023-11-14 (×2): 100 ug via INTRAVENOUS

## 2023-11-14 MED ORDER — LACTATED RINGERS IV BOLUS
500.0000 mL | Freq: Once | INTRAVENOUS | Status: AC
  Administered 2023-11-14: 500 mL via INTRAVENOUS

## 2023-11-14 MED ORDER — POLYVINYL ALCOHOL 1.4 % OP SOLN
1.0000 [drp] | Freq: Four times a day (QID) | OPHTHALMIC | Status: DC | PRN
  Filled 2023-11-14: qty 15

## 2023-11-14 MED ORDER — POTASSIUM CHLORIDE 10 MEQ/50ML IV SOLN
10.0000 meq | INTRAVENOUS | Status: AC
  Administered 2023-11-14 (×2): 10 meq via INTRAVENOUS

## 2023-11-14 MED ORDER — MIDAZOLAM-SODIUM CHLORIDE 100-0.9 MG/100ML-% IV SOLN
0.0000 mg/h | INTRAVENOUS | Status: DC
  Administered 2023-11-14: 2 mg/h via INTRAVENOUS
  Filled 2023-11-14: qty 100

## 2023-11-14 MED ORDER — ACETAMINOPHEN 650 MG RE SUPP: 650.0000 mg | Freq: Four times a day (QID) | RECTAL | Status: DC | PRN

## 2023-11-14 MED ORDER — MIDAZOLAM HCL 2 MG/2ML IJ SOLN: 1.0000 mg | INTRAMUSCULAR | Status: DC | PRN

## 2023-11-14 MED ORDER — DOCUSATE SODIUM 50 MG/5ML PO LIQD: 100.0000 mg | Freq: Two times a day (BID) | ORAL | Status: DC | PRN

## 2023-11-14 MED ORDER — POLYETHYLENE GLYCOL 3350 17 G PO PACK: 17.0000 g | PACK | Freq: Every day | ORAL | Status: DC | PRN

## 2023-11-14 MED ORDER — ACETAMINOPHEN 325 MG PO TABS: 650.0000 mg | ORAL_TABLET | Freq: Four times a day (QID) | ORAL | Status: DC | PRN

## 2023-11-14 MED ORDER — GLYCOPYRROLATE 0.2 MG/ML IJ SOLN
0.2000 mg | INTRAMUSCULAR | Status: DC | PRN
  Administered 2023-11-14: 0.2 mg via INTRAVENOUS
  Filled 2023-11-14: qty 1

## 2023-11-14 MED ORDER — GLYCOPYRROLATE 0.2 MG/ML IJ SOLN: 0.2000 mg | INTRAMUSCULAR | Status: DC | PRN

## 2023-11-14 MED ORDER — POTASSIUM CHLORIDE 10 MEQ/100ML IV SOLN
10.0000 meq | INTRAVENOUS | Status: DC
Start: 2023-11-14 — End: 2023-11-14
  Administered 2023-11-14 (×2): 10 meq via INTRAVENOUS
  Filled 2023-11-14 (×4): qty 100

## 2023-11-14 MED ORDER — POTASSIUM CHLORIDE 10 MEQ/50ML IV SOLN
10.0000 meq | INTRAVENOUS | Status: AC
  Administered 2023-11-14 (×3): 10 meq via INTRAVENOUS
  Filled 2023-11-14 (×3): qty 50

## 2023-11-14 MED ORDER — ROCURONIUM BROMIDE 10 MG/ML (PF) SYRINGE
100.0000 mg | PREFILLED_SYRINGE | Freq: Once | INTRAVENOUS | Status: AC
  Administered 2023-11-14: 100 mg via INTRAVENOUS
  Filled 2023-11-14: qty 10

## 2023-11-14 MED ORDER — PIPERACILLIN-TAZOBACTAM 3.375 G IVPB
3.3750 g | Freq: Three times a day (TID) | INTRAVENOUS | Status: DC
  Administered 2023-11-14: 3.375 g via INTRAVENOUS
  Filled 2023-11-14: qty 50

## 2023-11-14 MED ORDER — METHYLENE BLUE (ANTIDOTE) 1 % IV SOLN
2.0000 mg/kg | Status: AC
  Administered 2023-11-14: 268 mg via INTRAVENOUS
  Filled 2023-11-14: qty 26.8

## 2023-11-14 MED ORDER — MIDAZOLAM-SODIUM CHLORIDE 100-0.9 MG/100ML-% IV SOLN: 0.0000 mg/h | INTRAVENOUS | Status: DC

## 2023-11-14 SURGICAL SUPPLY — 14 items

## 2023-11-15 LAB — BPAM RBC
Blood Product Expiration Date: 202503132359
ISSUE DATE / TIME: 202502230338
Unit Type and Rh: 5100

## 2023-11-15 LAB — BPAM FFP
Blood Product Expiration Date: 202502252359
ISSUE DATE / TIME: 202502230339
Unit Type and Rh: 6200

## 2023-11-15 LAB — TYPE AND SCREEN
ABO/RH(D): A POS
Antibody Screen: NEGATIVE
Unit division: 0

## 2023-11-15 LAB — PREPARE FRESH FROZEN PLASMA

## 2023-11-15 LAB — PREPARE PLATELET PHERESIS: Unit division: 0

## 2023-11-15 LAB — BPAM PLATELET PHERESIS
Blood Product Expiration Date: 202502242359
ISSUE DATE / TIME: 202502230339
Unit Type and Rh: 6200

## 2023-11-16 ENCOUNTER — Encounter (HOSPITAL_COMMUNITY): Payer: Self-pay | Admitting: Internal Medicine

## 2023-11-16 LAB — CULTURE, RESPIRATORY W GRAM STAIN

## 2023-11-16 LAB — LEGIONELLA PNEUMOPHILA SEROGP 1 UR AG: L. pneumophila Serogp 1 Ur Ag: NEGATIVE

## 2023-11-18 LAB — CULTURE, BLOOD (ROUTINE X 2)
Special Requests: ADEQUATE
Special Requests: ADEQUATE

## 2023-11-18 MED FILL — Fentanyl Citrate Preservative Free (PF) Inj 100 MCG/2ML: INTRAMUSCULAR | Qty: 2 | Status: AC

## 2023-11-18 MED FILL — Ketamine HCl Inj 10 MG/ML: INTRAMUSCULAR | Qty: 20 | Status: AC

## 2023-11-18 MED FILL — Norepinephrine Bitartrate IV Soln 1 MG/ML (Base Equivalent): INTRAVENOUS | Qty: 4 | Status: AC

## 2023-11-18 MED FILL — Sodium Bicarbonate IV Soln 8.4%: INTRAVENOUS | Qty: 50 | Status: AC

## 2023-11-18 MED FILL — Phenylephrine HCl IV Soln 10 MG/ML: INTRAVENOUS | Qty: 1 | Status: AC

## 2023-11-20 NOTE — Anesthesia Preprocedure Evaluation (Signed)
 Anesthesia Evaluation    Airway        Dental   Pulmonary pneumonia, unresolved, COPD,  COPD inhaler, Current Smoker Intubated and sedated          Cardiovascular hypertension, Pt. on medications and Pt. on home beta blockers   Septic shock: pressors   Neuro/Psych   Anxiety Depression       GI/Hepatic ,GERD  Medicated,,(+) Cirrhosis         Endo/Other    Renal/GU      Musculoskeletal   Abdominal   Peds  Hematology  (+) Blood dyscrasia (Hb 7.8, plt 214k), anemia   Anesthesia Other Findings   Reproductive/Obstetrics                              Anesthesia Physical Anesthesia Plan Anesthesia Quick Evaluation

## 2023-11-20 NOTE — Progress Notes (Signed)
 UNMATCHED BLOOD PRODUCT NOTE  Compare the patient ID on the blood tag to the patient ID on the hospital armband and Blood Bank armband. Then confirm the unit number on the blood tag matches the unit number on the blood product.  If a discrepancy is discovered return the product to blood bank immediately.   Blood Product Type: Packed Red Blood Cells  Unit #: (Found on blood product bag, begins with W) [id number]  Product Code #: (Found on blood product bag, begins with E) [id number]   Start Time: 0411  Starting Rate: 999 ml/hr  Rate increase/decreased  (if applicable):      ml/hr  Rate changed time (if applicable):    Stop Time: 0445   All Other Documentation should be documented within the Blood Admin Flowsheet per policy.

## 2023-11-20 NOTE — Op Note (Addendum)
 [facility] Patient Name: Chad Glass Procedure Date : 11/08/2023 MRN: [id number] Attending MD: Particia Lather , , [id number] Date of Birth: 16-Jun-1971 CSN: [id number] Age: 53 Admit Type: Inpatient Procedure:                Upper GI endoscopy Indications:              Hematemesis, Melena Providers:                Madelyn Brunner" Jeryl Columbia, RN, Alan Ripper, Technician Referring MD:             ICU team Medicines:                Sedation was provided by ICU team Complications:            No immediate complications. Estimated Blood Loss:     Estimated blood loss was minimal. Procedure:                Pre-Anesthesia Assessment:                           - Prior to the procedure, a History and Physical                            was performed, and patient medications and                            allergies were reviewed. The patient's tolerance of                            previous anesthesia was also reviewed. The risks                            and benefits of the procedure and the sedation                            options and risks were discussed with the patient.                            All questions were answered, and informed consent                            was obtained. Prior Anticoagulants: The patient has                            taken no anticoagulant or antiplatelet agents. ASA                            Grade Assessment: III - A patient with severe                            systemic disease. After reviewing the risks and  benefits, the patient was deemed in satisfactory                            condition to undergo the procedure.                           After obtaining informed consent, the endoscope was                            passed under direct vision. Throughout the                            procedure, the patient's blood pressure, pulse, and                             oxygen saturations were monitored continuously. The                            [id number] ([id number]) Therapeutic endoscope was                            introduced through the mouth, and advanced to the                            second part of duodenum. The upper GI endoscopy was                            accomplished without difficulty. The patient                            tolerated the procedure well. Scope In: Scope Out: Findings:      Large (> 5 mm) varices were found in the distal esophagus with red wale       sign. There were no nipples present. Nine bands were successfully placed       with complete eradication, resulting in deflation of varices. There was       no bleeding at the end of the procedure.      A large amount of food (residue) and blood clot was found in the gastric       body and antrum. This was extensively cleared and stomach did not reveal       any source of bleeding. There were no gastric varices or gastric ulcers       visualized.      The examined duodenum was normal. Impression:               - Large (> 5 mm) esophageal varices. Completely                            eradicated. Banded.                           - A large amount of food (residue) and blood clot                            in the stomach.  This was cleared with no source of                            bleeding identified in the stomach.                           - Normal examined duodenum.                           - No specimens collected. Recommendation:           - Continue ICU care.                           - Although no active bleeding was noted and                            esophageal varices did not have an obvious nipple,                            no other source of upper GI bleed was found. Thus I                            opted to treat his esophageal varices, presuming                            due to their large size that they were the likely                             source of bleeding.                           - Continue PPI BID, octreotide gtt for 72 hours,                            and broad spectrum antibiotics for 7 days                           - Please do not place an OGT or NGT for now.                           - Continue to trend his blood counts.                           - May need lactulose enemas to help with his mental                            status in the future.                           - Will check RUQ U/S.                           - Would recommend paracentesis if there is enough  fluid to be able to be drained. Recommend sending                            of ascites fluid off for cell count, culture, total                            protein, and albumin.                           - Repeat EGD in 1 month as part of banding protocol.                           - The findings and recommendations were discussed                            with the ICU team and the patient's fiance Cheri Rous. Procedure Code(s):        --- Professional ---                           [id number], Esophagogastroduodenoscopy, flexible,                            transoral; with band ligation of esophageal/gastric                            varices Diagnosis Code(s):        --- Professional ---                           I85.00, Esophageal varices without bleeding                           K92.0, Hematemesis                           K92.1, Melena (includes Hematochezia) CPT copyright 2022 [employer]. All rights reserved. The codes documented in this report are preliminary and upon coder review may  be revised to meet current compliance requirements. Dr Particia Lather "Alan Ripper" [place],  11/01/2023 8:04:35 AM Number of Addenda: 0

## 2023-11-20 NOTE — Progress Notes (Signed)
 UNMATCHED BLOOD PRODUCT NOTE  Compare the patient ID on the blood tag to the patient ID on the hospital armband and Blood Bank armband. Then confirm the unit number on the blood tag matches the unit number on the blood product.  If a discrepancy is discovered return the product to blood bank immediately.   Blood Product Type: Platelets (Pheresed)  Unit #: (Found on blood product bag, begins with W) [id number]  Product Code #: (Found on blood product bag, begins with E) [id number]   Start Time: 0505  Starting Rate: 999 ml/hr  Rate increase/decreased  (if applicable):      ml/hr  Rate changed time (if applicable):    Stop Time: 0530   All Other Documentation should be documented within the Blood Admin Flowsheet per policy.

## 2023-11-20 NOTE — Progress Notes (Signed)
.  UNMATCHED BLOOD PRODUCT NOTE  Compare the patient ID on the blood tag to the patient ID on the hospital armband and Blood Bank armband. Then confirm the unit number on the blood tag matches the unit number on the blood product.  If a discrepancy is discovered return the product to blood bank immediately.   Blood Product Type: Fresh Frozen Plasma  Unit #: (Found on blood product bag, begins with W) [id number]  Product Code #: (Found on blood product bag, begins with E) [id number]   Start Time: 0450  Starting Rate: 999 ml/hr  Rate increase/decreased  (if applicable):      ml/hr  Rate changed time (if applicable):    Stop Time: 0500   All Other Documentation should be documented within the Blood Admin Flowsheet per policy.

## 2023-11-20 NOTE — Consult Note (Addendum)
 Referring Provider:    Janann Colonel, MD      Primary Care Physician:  Carmel Sacramento, NP Primary Gastroenterologist:   Gentry Fitz          Reason for Consultation: Emesis, melena                 ASSESSMENT /  PLAN    Hematemesis Melena Anemia Patient came in with a few days of hematemesis and melena.  Hemoglobin was found to be 8.1.  Patient received 2 units of PRBCs and hemoglobin on recheck is 7.8.  Patient has no known history of liver disease but his labs such as elevated bilirubin and low platelets as well as presence of ascites on imaging could suggest underlying cirrhosis.  Patient is currently on vasopressors for shock that is likely a combination of septic and hemorrhagic shock.  Thus we will plan to proceed with EGD for further evaluation. - Trend hemoglobin closely - PPI BID, octreotide gtt, broad spectrum antibiotics - EGD in ICU.  ICU team will be assisting with sedation for [place].  I was able to obtain a phone consent from [facility], the patient's fianc.  Unfortunately I was not able to reach the patient's son after 2 attempts, and his fianc did not have have the phone number for his nephew.  Patient's fianc reports that the patient's parents are now deceased.   HPI:     Chad Glass is a 53 y.o. male with history of OSA on CPAP, tobacco use, GERD, COPD, anxiety, and depression presented with shortness of breath, hematemesis, and melena.  History had to be obtain from notes and fiance since patient is intubated and sedated. He reported swelling in his legs that has been worsening over time.  Shortness of breath started a few days ago and multiple family members have been sick as well.  He started having 3-4 episodes of hematemesis per day over the last 4 days.  Has also had melena and diarrhea over that course of time.  He has never had an EGD or colonoscopy in the past.  He has a history of alcohol use but that he quit drinking alcohol over a year ago. No known history  of liver disease   He was noted to be hypoxic upon arrival.  Labs were notable for elevated AST of 85, elevated total bilirubin of 4.4, hemoglobin of 8.1, decreased platelets of 166.  Chest x-ray showed left lower lobe opacity consistent with pneumonia.  Chest CT without contrast showed several areas of pneumonia in the right upper lobe, lingula, and left lower lobe.  He was also noted to have upper abdominal ascites.  Then patient became altered so he was intubated and sedated.  He was also hypotensive requiring initiation of vasopressors.   Past Medical History:  Diagnosis Date   Anxiety    Chronic back pain    Chronic neck pain    Chronic obstructive pulmonary disease, unspecified (HCC)    Depression    Gastro-esophageal reflux disease with esophagitis    GERD (gastroesophageal reflux disease)    Low back pain    Major depressive disorder, single episode, unspecified    Other hemorrhoids    Sleep apnea    CiPaP at night   Sleep apnea, unspecified     Past Surgical History:  Procedure Laterality Date   KNEE SURGERY     right-cut around the cartilage. Dr. Romeo Apple at [facility]   UMBILICAL HERNIA REPAIR  11/30/2011  Procedure: HERNIA REPAIR UMBILICAL ADULT;  Surgeon: Fabio Bering, MD;  Location: [facility];  Service: General;  Laterality: N/A;    Prior to Admission medications   Medication Sig Start Date End Date Taking? Authorizing Provider  oxyCODONE (ROXICODONE) 15 MG immediate release tablet Take 15 mg by mouth 4 (four) times daily as needed for pain.   Yes [provider]  pregabalin (LYRICA) 100 MG capsule Take 100 mg by mouth daily. 11/09/23  Yes [provider]  VENTOLIN HFA 108 (90 Base) MCG/ACT inhaler Inhale 1 puff into the lungs every 6 (six) hours as needed for wheezing or shortness of breath. 10/22/23  Yes [provider]  cetirizine (ZYRTEC) 10 MG tablet Take 10 mg by mouth daily.    [provider]  clonazePAM (KLONOPIN)  0.5 MG tablet Take 0.5 mg by mouth 2 (two) times daily as needed for anxiety. Patient not taking: Reported on 11/13/2023 04/06/22   [provider]  cyclobenzaprine (FLEXERIL) 5 MG tablet Take 5 mg by mouth 3 (three) times daily as needed for muscle spasms.    [provider]  DULoxetine (CYMBALTA) 30 MG capsule Take 90 mg by mouth daily.    [provider]  hydrOXYzine (ATARAX) 25 MG tablet Take 25 mg by mouth 2 (two) times daily. Patient not taking: Reported on 11/13/2023 09/07/23   [provider]  pantoprazole (PROTONIX) 40 MG tablet Take 1 tablet (40 mg total) by mouth 2 (two) times daily before a meal. 06/01/22   Aida Raider, NP  propranolol ER (INDERAL LA) 120 MG 24 hr capsule Take 120 mg by mouth daily. 07/07/22   [provider]  sertraline (ZOLOFT) 50 MG tablet Take 50 mg by mouth daily. 01/23/22   [provider]  Sod Picosulfate-Mag Ox-Cit Acd (CLENPIQ) 10-3.5-12 MG-GM -GM/175ML SOLN Take 1 kit by mouth as directed. 06/22/22   Rourk, Gerrit Friends, MD  XYOSTED 75 MG/0.5ML SOAJ Inject 75 mg into the skin every 7 (seven) days. Patient not taking: Reported on 11/13/2023 01/26/22   [provider]    Current Facility-Administered Medications  Medication Dose Route Frequency Provider Last Rate Last Admin   0.9 %  sodium chloride infusion (Manually program via Guardrails IV Fluids)   Intravenous Once Benjiman Core, MD   Held at 11/13/23 2023   0.9 %  sodium chloride infusion (Manually program via Guardrails IV Fluids)   Intravenous Once Assaker, [place], MD       0.9 %  sodium chloride infusion (Manually program via Guardrails IV Fluids)   Intravenous Once Mertha Baars E, PA-C       0.9 %  sodium chloride infusion  250 mL Intravenous Continuous Adefeso, Oladapo, DO   Stopped at 11/17/2023 0045   acetaminophen (TYLENOL) tablet 650 mg  650 mg Oral Q6H PRN Dezii, Alexandra, DO       azithromycin (ZITHROMAX) 500 mg in sodium chloride  0.9 % 250 mL IVPB  500 mg Intravenous Q24H Assaker, Jean-Pierre, MD       calcium gluconate 2 g/ 100 mL sodium chloride IVPB  2 g Intravenous Once Assaker, [place], MD 100 mL/hr at 10/29/2023 0407 2,000 mg at 11/02/2023 0407   ceFEPIme (MAXIPIME) 2 g in sodium chloride 0.9 % 100 mL IVPB  2 g Intravenous Q8H Hunt, Madison H, RPH   Stopped at 11/13/23 2020   Chlorhexidine Gluconate Cloth 2 % PADS 6 each  6 each Topical Daily Adefeso, Oladapo, DO   6 each at  11/03/2023 0354   docusate (COLACE) 50 MG/5ML liquid 100 mg  100 mg Per Tube BID PRN Assaker, [place], MD       famotidine (PEPCID) tablet 20 mg  20 mg Per Tube BID Cristopher Peru, PA-C       fentaNYL (SUBLIMAZE) bolus via infusion 50-100 mcg  50-100 mcg Intravenous Q15 min PRN Cristopher Peru, PA-C       fentaNYL (SUBLIMAZE) injection 50 mcg  50 mcg Intravenous Once Mertha Baars E, PA-C       fentaNYL in NS (78mcg/ml) infusion-PREMIX  50-200 mcg/hr Intravenous Continuous Cristopher Peru, PA-C 5 mL/hr at 11/09/2023 0403 50 mcg/hr at 10/30/2023 0403   ipratropium-albuterol (DUONEB) 0.5-2.5 (3) MG/3ML nebulizer solution 3 mL  3 mL Nebulization Q4H Dezii, Alexandra, DO   3 mL at 11/01/2023 0321   methylPREDNISolone sodium succinate (SOLU-MEDROL) 40 mg/mL injection 40 mg  40 mg Intravenous Daily Assaker, [place], MD       norepinephrine (LEVOPHED) 16 mg in (0.064 mg/mL) premix infusion  0-40 mcg/min Intravenous Titrated Cristopher Peru, PA-C 23.4 mL/hr at 11/11/2023 0424 25 mcg/min at 10/27/2023 0424   octreotide (SANDOSTATIN) 500 mcg in sodium chloride 0.9 % 250 mL (2 mcg/mL) infusion  50 mcg/hr Intravenous Continuous Cristopher Peru, PA-C       Oral care mouth rinse  15 mL Mouth Rinse Q2H Adefeso, Oladapo, DO   15 mL at 11/12/2023 0439   Oral care mouth rinse  15 mL Mouth Rinse PRN Adefeso, Oladapo, DO       pantoprazole (PROTONIX) injection 40 mg  40 mg Intravenous Q12H Benjiman Core, MD       phenylephrine CONCENTRATED 100mg  in  sodium chloride 0.9% (0.4mg /mL) premix infusion  25-200 mcg/min Intravenous Titrated Assaker, [place], MD   Held at 11/03/2023 0440   polyethylene glycol (MIRALAX / GLYCOLAX) packet 17 g  17 g Per Tube Daily PRN Assaker, [place], MD       potassium chloride 10 mEq in 100 mL IVPB  10 mEq Intravenous Q1 Hr x 4 Adefeso, Oladapo, DO 100 mL/hr at 11/03/2023 0358 10 mEq at 11/05/2023 0358   propofol (DIPRIVAN) 1000 MG/100ML infusion  5-80 mcg/kg/min Intravenous Titrated Adefeso, Oladapo, DO 19.05 mL/hr at 11/06/2023 0138 25 mcg/kg/min at 10/29/2023 0138   sodium bicarbonate 1 mEq/mL injection            sodium bicarbonate 150 mEq in dextrose 5 % 1,150 mL infusion   Intravenous Continuous Cristopher Peru, PA-C 125 mL/hr at 11/04/2023 0434 New Bag at 11/16/2023 0434   vancomycin (VANCOREADY) IVPB 2000 mg/400 mL  2,000 mg Intravenous Q24H Hunt, Madison H, RPH       vasopressin (PITRESSIN) 20 Units in 100 mL (0.2 unit/mL) infusion-*FOR SHOCK*  0.04 Units/min Intravenous Continuous Assaker, [place], MD 12 mL/hr at 10/30/2023 0413 0.04 Units/min at 11/03/2023 0413    Allergies as of 11/13/2023   (No Known Allergies)    Family History  Problem Relation Age of Onset   Breast cancer Mother    Arthritis Other    Diabetes Other    Anesthesia problems Neg Hx    Hypotension Neg Hx    Pseudochol deficiency Neg Hx    Malignant hyperthermia Neg Hx     Social History   Tobacco Use   Smoking status: Every Day    Current packs/day: 0.75    Average packs/day: 0.8 packs/day for 30.0 years (22.5 ttl pk-yrs)    Types: Cigarettes  Substance Use Topics   Alcohol use: No   Drug use: No    Review of Systems: All systems reviewed and negative except where noted in HPI.  Physical Exam: Vital signs in last 24 hours: Temp:  [97.6 F (36.4 C)-99.3 F (37.4 C)] 97.6 F (36.4 C) (02/23 0245) Pulse Rate:  [112-142] 112 (02/23 0245) Resp:  [16-37] 30 (02/23 0245) BP: (89-163)/(30-124) 106/48 (02/23  0245) SpO2:  [83 %-100 %] 91 % (02/23 0245) FiO2 (%):  [50 %-100 %] 60 % (02/23 0402) Weight:  [127 kg-134 kg] 134 kg (02/23 0245)   General: Ill appearing Eyes: Positive for scleral icterus Lungs: Intubated Heart: Tachycardic Abdomen:  Soft, distended.  Nontender, though patient is sedated Extremities: 1+ bilateral lower extremity edema Neurologic: Sedated   Intake/Output from previous day: 02/22 0701 - 02/23 0700 In: 1057.2 [I.V.:707.2; IV Piggyback:350] Out: -  Intake/Output this shift: Total I/O In: 707.2 [I.V.:707.2] Out: -   Lab Results: Recent Labs    11/13/23 1239 11/13/23 2143 11/08/2023 0338 11/15/2023 0348  WBC 12.3* 20.9* 30.4*  --   HGB 8.1* 8.2* 7.8* 7.8*  HCT 25.0* 26.4* 24.5* 23.0*  PLT 166 190 214  --    BMET Recent Labs    11/13/23 1239 11/13/23 2143 11/06/2023 0348  NA 141 144 144  K 2.6* 3.0* 3.2*  CL 107 108  --   CO2 22 14*  --   GLUCOSE 122* 130*  --   BUN 20 28*  --   CREATININE 1.36* 1.70*  --   CALCIUM 7.7* 7.4*  --    LFT Recent Labs    11/13/23 1239  PROT 6.0*  ALBUMIN 2.3*  AST 85*  ALT 22  ALKPHOS 87  BILITOT 4.4*   PT/INR Recent Labs    11/13/23 1338  LABPROT 18.8*  INR 1.6*   Hepatitis Panel No results for input(s): "HEPBSAG", "HCVAB", "HEPAIGM", "HEPBIGM" in the last 72 hours.   .    Latest Ref Rng & Units 11/03/2023    3:48 AM 10/31/2023    3:38 AM 11/13/2023    9:43 PM  CBC  WBC 4.0 - 10.5 K/uL  30.4  20.9   Hemoglobin 13.0 - 17.0 g/dL 7.8  7.8  8.2   Hematocrit 39.0 - 52.0 % 23.0  24.5  26.4   Platelets 150 - 400 K/uL  214  190     .    Latest Ref Rng & Units 10/25/2023    3:48 AM 11/13/2023    9:43 PM 11/13/2023   12:39 PM  CMP  Glucose 70 - 99 mg/dL  696  295   BUN 6 - 20 mg/dL  28  20   Creatinine 2.84 - 1.24 mg/dL  1.32  4.40   Sodium 102 - 145 mmol/L 144  144  141   Potassium 3.5 - 5.1 mmol/L 3.2  3.0  2.6   Chloride 98 - 111 mmol/L  108  107   CO2 22 - 32 mmol/L  14  22   Calcium 8.9 -  10.3 mg/dL  7.4  7.7   Total Protein 6.5 - 8.1 g/dL   6.0   Total Bilirubin 0.0 - 1.2 mg/dL   4.4   Alkaline Phos 38 - 126 U/L   87   AST 15 - 41 U/L   85   ALT 0 - 44 U/L   22    Studies/Results: DG CHEST PORT 1 VIEW Result Date: 11/11/2023 CLINICAL DATA:  Central  line placement EXAM: PORTABLE CHEST 1 VIEW COMPARISON:  11/18/2023 FINDINGS: Support Apparatus: --Endotracheal tube: Tip at the level of the clavicular heads. --Enteric tube:Tip and sideport are below the field of view. --Vascular catheter(s):Right internal jugular vein approach central venous catheter tip is in the proximal right atrium. --Other: None Left retrocardiac consolidation and small left pleural effusion. IMPRESSION: 1. Right internal jugular vein approach central venous catheter tip is in the proximal right atrium. 2. Left retrocardiac consolidation and small left pleural effusion. Electronically Signed   By: Deatra Robinson M.D.   On: 11/18/2023 03:58   DG CHEST PORT 1 VIEW Result Date: 11/03/2023 CLINICAL DATA:  Intubated EXAM: PORTABLE CHEST 1 VIEW COMPARISON:  11/13/2023 FINDINGS: Endotracheal tube and NG tube remain in place. Cardiomegaly, vascular congestion. Right upper lobe and left lower lobe airspace opacities are unchanged. No effusions or acute bony abnormality. IMPRESSION: Support devices in expected position. Right upper lobe and left lower lobe airspace opacities again noted, not significantly changed. Electronically Signed   By: Charlett Nose M.D.   On: 11/01/2023 03:16   DG Chest Portable 1 View Result Date: 11/13/2023 CLINICAL DATA:  ETT pulled back EXAM: PORTABLE CHEST 1 VIEW COMPARISON:  CT chest dated 11/13/2023 FINDINGS: Endotracheal tube terminates 2.5 cm above the carina. Enteric tube courses into the stomach. Multifocal patchy right upper and left lower lobe opacities, suspicious for pneumonia. No pleural effusion or pneumothorax. The heart is top-normal in size. IMPRESSION: Endotracheal tube terminates 2.5  cm above the carina. Multifocal patchy right upper and left lower lobe opacities, suspicious for pneumonia. Electronically Signed   By: Charline Bills M.D.   On: 11/13/2023 23:44   CT CHEST WO CONTRAST Result Date: 11/13/2023 CLINICAL DATA:  Short of breath for 4 days, intubated EXAM: CT CHEST WITHOUT CONTRAST TECHNIQUE: Multidetector CT imaging of the chest was performed following the standard protocol without IV contrast. RADIATION DOSE REDUCTION: This exam was performed according to the departmental dose-optimization program which includes automated exposure control, adjustment of the mA and/or kV according to patient size and/or use of iterative reconstruction technique. COMPARISON:  11/13/2023 FINDINGS: Cardiovascular: Unenhanced imaging of the heart is unremarkable without pericardial effusion. Normal caliber of the thoracic aorta. Evaluation of the vascular lumen is limited without IV contrast. Mediastinum/Nodes: Endotracheal tube identified, tip within the origin of the right mainstem bronchus, recommend retracting 2.5 cm. Enteric catheter passes below diaphragm, tip within the lumen of the gastric antrum. Thyroid is unremarkable. No pathologic adenopathy. Lungs/Pleura: There is complete consolidation of the left lower lobe, with superimposed air bronchograms as well as volume loss. This likely represents a combination of airspace disease and atelectasis. Areas of consolidation and volume loss within the lingula and right upper lobe favor atelectasis. No effusion or pneumothorax. Upper Abdomen: Moderate ascites within the right upper quadrant. No other acute findings. Musculoskeletal: No acute or destructive bony abnormalities. Bilateral gynecomastia. Reconstructed images demonstrate no additional findings. IMPRESSION: 1. Right mainstem intubation, with endotracheal tube tip at the origin of the right mainstem bronchus. Recommend retracting 2.5 cm. 2. Dense areas of consolidation within the right upper  lobe, lingula, and left lower lobe. Findings are consistent with a combination of pneumonia and atelectasis. 3. Upper abdominal ascites. 4. Bilateral gynecomastia. These results will be called to the ordering clinician or representative by the Radiologist Assistant, and communication documented in the PACS or [employer]. Electronically Signed   By: Sharlet Salina M.D.   On: 11/13/2023 22:47   DG Chest Portable 1  View Addendum Date: 11/13/2023 ADDENDUM REPORT: 11/13/2023 21:22 ADDENDUM: These results were called by telephone at the time of interpretation on 11/13/2023 at 9:22 pm to provider OLADAPO ADEFESO , who verbally acknowledged these results. Electronically Signed   By: Darliss Cheney M.D.   On: 11/13/2023 21:22   Result Date: 11/13/2023 CLINICAL DATA:  Intubation EXAM: PORTABLE CHEST 1 VIEW COMPARISON:  Chest x-ray 11/13/2023 FINDINGS: The tip of the endotracheal tube is in the proximal right mainstem bronchus. Enteric tube extends below the diaphragm. The heart is enlarged, unchanged. There is a new band of atelectasis in the right upper lobe. There stable patchy atelectasis/airspace disease in the left lung base. There is no pneumothorax or pleural effusion. The osseous structures are stable. IMPRESSION: 1. The tip of the endotracheal tube is in the proximal right mainstem bronchus. Recommend retraction 3 cm. 2. New band of atelectasis in the right upper lobe. 3. Stable patchy atelectasis/airspace disease in the left lung base. Electronically Signed: By: Darliss Cheney M.D. On: 11/13/2023 21:19   DG Chest 2 View Result Date: 11/13/2023 CLINICAL DATA:  Four day history of worsening shortness of breath EXAM: CHEST - 2 VIEW COMPARISON:  None Available. FINDINGS: Normal lung volumes. Left lower lobe opacity. No pleural effusion or pneumothorax. The heart size and mediastinal contours are within normal limits. No acute osseous abnormality. IMPRESSION: Left lower lobe opacity, suspicious for pneumonia.  Electronically Signed   By: Agustin Cree M.D.   On: 11/13/2023 13:30    Principal Problem:   COPD with acute exacerbation (HCC) Active Problems:   GI bleed   Acute respiratory failure with hypoxia (HCC)   Acute hypoxic respiratory failure (HCC)    Eulah Pont, M.D. @  10/27/2023, 4:41 AM

## 2023-11-20 NOTE — Procedures (Signed)
 Central Venous Catheter Insertion Procedure Note  CARY LOTHROP  [id number]  September 13, 1971  Date:11/06/2023  Time:3:47 AM   Provider Performing:Kamaile Zachow Bea Laura  Cherlynn Polo   Procedure: Insertion of Non-tunneled Central Venous Catheter(36556) with US guidance (40981)   Indication(s) Medication administration  Consent Unable to obtain consent due to emergent nature of procedure.  Anesthesia Topical only with 1% lidocaine   Timeout Verified patient identification, verified procedure, site/side was marked, verified correct patient position, special equipment/implants available, medications/allergies/relevant history reviewed, required imaging and test results available.  Sterile Technique Maximal sterile technique including full sterile barrier drape, hand hygiene, sterile gown, sterile gloves, mask, hair covering, sterile ultrasound probe cover (if used).  Procedure Description Area of catheter insertion was cleaned with chlorhexidine and draped in sterile fashion.  With real-time ultrasound guidance a central venous catheter was placed into the right internal jugular vein. Nonpulsatile blood flow and easy flushing noted in all ports.  The catheter was sutured in place and sterile dressing applied.  Complications/Tolerance None; patient tolerated the procedure well. Chest X-ray is ordered to verify placement for internal jugular or subclavian cannulation.   Chest x-ray is not ordered for femoral cannulation.  EBL Minimal  Specimen(s) None   Cristopher Peru, PA-C [facility] 11/07/2023 3:47 AM  Please see [email or web address] for pager details.  From 7A-7P if no response, please call [phone] After hours, please call ELink [phone]

## 2023-11-20 NOTE — Progress Notes (Signed)
 Patient transitioned to comfort care. Family at bedside. TOD called at 1729. Katrinka Blazing, MD made aware. No belongings at bedside.

## 2023-11-20 NOTE — Plan of Care (Signed)
 Patient transitioned to comfort care  Problem: Education: Goal: Knowledge of General Education information will improve Description: Including pain rating scale, medication(s)/side effects and non-pharmacologic comfort measures Outcome: Not Met (add Reason)   Problem: Health Behavior/Discharge Planning: Goal: Ability to manage health-related needs will improve Outcome: Not Met (add Reason)   Problem: Clinical Measurements: Goal: Ability to maintain clinical measurements within normal limits will improve Outcome: Not Met (add Reason) Goal: Will remain free from infection Outcome: Not Met (add Reason) Goal: Diagnostic test results will improve Outcome: Not Met (add Reason) Goal: Respiratory complications will improve Outcome: Not Met (add Reason) Goal: Cardiovascular complication will be avoided Outcome: Not Met (add Reason)   Problem: Activity: Goal: Risk for activity intolerance will decrease Outcome: Not Met (add Reason)   Problem: Nutrition: Goal: Adequate nutrition will be maintained Outcome: Not Met (add Reason)   Problem: Coping: Goal: Level of anxiety will decrease Outcome: Not Met (add Reason)   Problem: Elimination: Goal: Will not experience complications related to bowel motility Outcome: Not Met (add Reason) Goal: Will not experience complications related to urinary retention Outcome: Not Met (add Reason)   Problem: Pain Managment: Goal: General experience of comfort will improve and/or be controlled Outcome: Not Met (add Reason)   Problem: Safety: Goal: Ability to remain free from injury will improve Outcome: Not Met (add Reason)   Problem: Skin Integrity: Goal: Risk for impaired skin integrity will decrease Outcome: Not Met (add Reason)   Problem: Safety: Goal: Non-violent Restraint(s) Outcome: Not Met (add Reason)   Problem: Activity: Goal: Ability to tolerate increased activity will improve Outcome: Not Met (add Reason)   Problem:  Respiratory: Goal: Ability to maintain a clear airway and adequate ventilation will improve Outcome: Not Met (add Reason)   Problem: Role Relationship: Goal: Method of communication will improve Outcome: Not Met (add Reason)   Problem: Education: Goal: Ability to describe self-care measures that may prevent or decrease complications (Diabetes Survival Skills Education) will improve Outcome: Not Met (add Reason) Goal: Individualized Educational Video(s) Outcome: Not Met (add Reason)   Problem: Coping: Goal: Ability to adjust to condition or change in health will improve Outcome: Not Met (add Reason)   Problem: Fluid Volume: Goal: Ability to maintain a balanced intake and output will improve Outcome: Not Met (add Reason)   Problem: Health Behavior/Discharge Planning: Goal: Ability to identify and utilize available resources and services will improve Outcome: Not Met (add Reason) Goal: Ability to manage health-related needs will improve Outcome: Not Met (add Reason)   Problem: Metabolic: Goal: Ability to maintain appropriate glucose levels will improve Outcome: Not Met (add Reason)   Problem: Nutritional: Goal: Maintenance of adequate nutrition will improve Outcome: Not Met (add Reason) Goal: Progress toward achieving an optimal weight will improve Outcome: Not Met (add Reason)   Problem: Skin Integrity: Goal: Risk for impaired skin integrity will decrease Outcome: Not Met (add Reason)   Problem: Tissue Perfusion: Goal: Adequacy of tissue perfusion will improve Outcome: Not Met (add Reason)

## 2023-11-20 NOTE — ED Notes (Addendum)
 First run of potassium handed to [facility] for transport; 1L LR currently infusing; 1L NS completed

## 2023-11-20 NOTE — H&P (View-Only) (Signed)
 Referring Provider:    Janann Colonel, MD      Primary Care Physician:  Carmel Sacramento, NP Primary Gastroenterologist:   Gentry Fitz          Reason for Consultation: Emesis, melena                 ASSESSMENT /  PLAN    Hematemesis Melena Anemia Patient came in with a few days of hematemesis and melena.  Hemoglobin was found to be 8.1.  Patient received 2 units of PRBCs and hemoglobin on recheck is 7.8.  Patient has no known history of liver disease but his labs such as elevated bilirubin and low platelets as well as presence of ascites on imaging could suggest underlying cirrhosis.  Patient is currently on vasopressors for shock that is likely a combination of septic and hemorrhagic shock.  Thus we will plan to proceed with EGD for further evaluation. - Trend hemoglobin closely - PPI BID, octreotide gtt, broad spectrum antibiotics - EGD in ICU.  ICU team will be assisting with sedation for [place].  I was able to obtain a phone consent from [facility], the patient's fianc.  Unfortunately I was not able to reach the patient's son after 2 attempts, and his fianc did not have have the phone number for his nephew.  Patient's fianc reports that the patient's parents are now deceased.   HPI:     Chad Glass is a 53 y.o. male with history of OSA on CPAP, tobacco use, GERD, COPD, anxiety, and depression presented with shortness of breath, hematemesis, and melena.  History had to be obtain from notes and fiance since patient is intubated and sedated. He reported swelling in his legs that has been worsening over time.  Shortness of breath started a few days ago and multiple family members have been sick as well.  He started having 3-4 episodes of hematemesis per day over the last 4 days.  Has also had melena and diarrhea over that course of time.  He has never had an EGD or colonoscopy in the past.  He has a history of alcohol use but that he quit drinking alcohol over a year ago. No known history  of liver disease   He was noted to be hypoxic upon arrival.  Labs were notable for elevated AST of 85, elevated total bilirubin of 4.4, hemoglobin of 8.1, decreased platelets of 166.  Chest x-ray showed left lower lobe opacity consistent with pneumonia.  Chest CT without contrast showed several areas of pneumonia in the right upper lobe, lingula, and left lower lobe.  He was also noted to have upper abdominal ascites.  Then patient became altered so he was intubated and sedated.  He was also hypotensive requiring initiation of vasopressors.   Past Medical History:  Diagnosis Date   Anxiety    Chronic back pain    Chronic neck pain    Chronic obstructive pulmonary disease, unspecified (HCC)    Depression    Gastro-esophageal reflux disease with esophagitis    GERD (gastroesophageal reflux disease)    Low back pain    Major depressive disorder, single episode, unspecified    Other hemorrhoids    Sleep apnea    CiPaP at night   Sleep apnea, unspecified     Past Surgical History:  Procedure Laterality Date   KNEE SURGERY     right-cut around the cartilage. Dr. Romeo Apple at [facility]   UMBILICAL HERNIA REPAIR  11/30/2011  Procedure: HERNIA REPAIR UMBILICAL ADULT;  Surgeon: Fabio Bering, MD;  Location: [facility];  Service: General;  Laterality: N/A;    Prior to Admission medications   Medication Sig Start Date End Date Taking? Authorizing Provider  oxyCODONE (ROXICODONE) 15 MG immediate release tablet Take 15 mg by mouth 4 (four) times daily as needed for pain.   Yes [provider]  pregabalin (LYRICA) 100 MG capsule Take 100 mg by mouth daily. 11/09/23  Yes [provider]  VENTOLIN HFA 108 (90 Base) MCG/ACT inhaler Inhale 1 puff into the lungs every 6 (six) hours as needed for wheezing or shortness of breath. 10/22/23  Yes [provider]  cetirizine (ZYRTEC) 10 MG tablet Take 10 mg by mouth daily.    [provider]  clonazePAM (KLONOPIN)  0.5 MG tablet Take 0.5 mg by mouth 2 (two) times daily as needed for anxiety. Patient not taking: Reported on 11/13/2023 04/06/22   [provider]  cyclobenzaprine (FLEXERIL) 5 MG tablet Take 5 mg by mouth 3 (three) times daily as needed for muscle spasms.    [provider]  DULoxetine (CYMBALTA) 30 MG capsule Take 90 mg by mouth daily.    [provider]  hydrOXYzine (ATARAX) 25 MG tablet Take 25 mg by mouth 2 (two) times daily. Patient not taking: Reported on 11/13/2023 09/07/23   [provider]  pantoprazole (PROTONIX) 40 MG tablet Take 1 tablet (40 mg total) by mouth 2 (two) times daily before a meal. 06/01/22   Aida Raider, NP  propranolol ER (INDERAL LA) 120 MG 24 hr capsule Take 120 mg by mouth daily. 07/07/22   [provider]  sertraline (ZOLOFT) 50 MG tablet Take 50 mg by mouth daily. 01/23/22   [provider]  Sod Picosulfate-Mag Ox-Cit Acd (CLENPIQ) 10-3.5-12 MG-GM -GM/175ML SOLN Take 1 kit by mouth as directed. 06/22/22   Rourk, Gerrit Friends, MD  XYOSTED 75 MG/0.5ML SOAJ Inject 75 mg into the skin every 7 (seven) days. Patient not taking: Reported on 11/13/2023 01/26/22   [provider]    Current Facility-Administered Medications  Medication Dose Route Frequency Provider Last Rate Last Admin   0.9 %  sodium chloride infusion (Manually program via Guardrails IV Fluids)   Intravenous Once Benjiman Core, MD   Held at 11/13/23 2023   0.9 %  sodium chloride infusion (Manually program via Guardrails IV Fluids)   Intravenous Once Assaker, [place], MD       0.9 %  sodium chloride infusion (Manually program via Guardrails IV Fluids)   Intravenous Once Mertha Baars E, PA-C       0.9 %  sodium chloride infusion  250 mL Intravenous Continuous Adefeso, Oladapo, DO   Stopped at 11/17/2023 0045   acetaminophen (TYLENOL) tablet 650 mg  650 mg Oral Q6H PRN Dezii, Alexandra, DO       azithromycin (ZITHROMAX) 500 mg in sodium chloride  0.9 % 250 mL IVPB  500 mg Intravenous Q24H Assaker, Jean-Pierre, MD       calcium gluconate 2 g/ 100 mL sodium chloride IVPB  2 g Intravenous Once Assaker, [place], MD 100 mL/hr at 10/29/2023 0407 2,000 mg at 11/02/2023 0407   ceFEPIme (MAXIPIME) 2 g in sodium chloride 0.9 % 100 mL IVPB  2 g Intravenous Q8H Hunt, Madison H, RPH   Stopped at 11/13/23 2020   Chlorhexidine Gluconate Cloth 2 % PADS 6 each  6 each Topical Daily Adefeso, Oladapo, DO   6 each at  11/03/2023 0354   docusate (COLACE) 50 MG/5ML liquid 100 mg  100 mg Per Tube BID PRN Assaker, [place], MD       famotidine (PEPCID) tablet 20 mg  20 mg Per Tube BID Cristopher Peru, PA-C       fentaNYL (SUBLIMAZE) bolus via infusion 50-100 mcg  50-100 mcg Intravenous Q15 min PRN Cristopher Peru, PA-C       fentaNYL (SUBLIMAZE) injection 50 mcg  50 mcg Intravenous Once Mertha Baars E, PA-C       fentaNYL in NS (78mcg/ml) infusion-PREMIX  50-200 mcg/hr Intravenous Continuous Cristopher Peru, PA-C 5 mL/hr at 11/09/2023 0403 50 mcg/hr at 10/30/2023 0403   ipratropium-albuterol (DUONEB) 0.5-2.5 (3) MG/3ML nebulizer solution 3 mL  3 mL Nebulization Q4H Dezii, Alexandra, DO   3 mL at 11/01/2023 0321   methylPREDNISolone sodium succinate (SOLU-MEDROL) 40 mg/mL injection 40 mg  40 mg Intravenous Daily Assaker, [place], MD       norepinephrine (LEVOPHED) 16 mg in (0.064 mg/mL) premix infusion  0-40 mcg/min Intravenous Titrated Cristopher Peru, PA-C 23.4 mL/hr at 11/11/2023 0424 25 mcg/min at 10/27/2023 0424   octreotide (SANDOSTATIN) 500 mcg in sodium chloride 0.9 % 250 mL (2 mcg/mL) infusion  50 mcg/hr Intravenous Continuous Cristopher Peru, PA-C       Oral care mouth rinse  15 mL Mouth Rinse Q2H Adefeso, Oladapo, DO   15 mL at 11/12/2023 0439   Oral care mouth rinse  15 mL Mouth Rinse PRN Adefeso, Oladapo, DO       pantoprazole (PROTONIX) injection 40 mg  40 mg Intravenous Q12H Benjiman Core, MD       phenylephrine CONCENTRATED 100mg  in  sodium chloride 0.9% (0.4mg /mL) premix infusion  25-200 mcg/min Intravenous Titrated Assaker, [place], MD   Held at 11/03/2023 0440   polyethylene glycol (MIRALAX / GLYCOLAX) packet 17 g  17 g Per Tube Daily PRN Assaker, [place], MD       potassium chloride 10 mEq in 100 mL IVPB  10 mEq Intravenous Q1 Hr x 4 Adefeso, Oladapo, DO 100 mL/hr at 11/03/2023 0358 10 mEq at 11/05/2023 0358   propofol (DIPRIVAN) 1000 MG/100ML infusion  5-80 mcg/kg/min Intravenous Titrated Adefeso, Oladapo, DO 19.05 mL/hr at 11/06/2023 0138 25 mcg/kg/min at 10/29/2023 0138   sodium bicarbonate 1 mEq/mL injection            sodium bicarbonate 150 mEq in dextrose 5 % 1,150 mL infusion   Intravenous Continuous Cristopher Peru, PA-C 125 mL/hr at 11/04/2023 0434 New Bag at 11/16/2023 0434   vancomycin (VANCOREADY) IVPB 2000 mg/400 mL  2,000 mg Intravenous Q24H Hunt, Madison H, RPH       vasopressin (PITRESSIN) 20 Units in 100 mL (0.2 unit/mL) infusion-*FOR SHOCK*  0.04 Units/min Intravenous Continuous Assaker, [place], MD 12 mL/hr at 10/30/2023 0413 0.04 Units/min at 11/03/2023 0413    Allergies as of 11/13/2023   (No Known Allergies)    Family History  Problem Relation Age of Onset   Breast cancer Mother    Arthritis Other    Diabetes Other    Anesthesia problems Neg Hx    Hypotension Neg Hx    Pseudochol deficiency Neg Hx    Malignant hyperthermia Neg Hx     Social History   Tobacco Use   Smoking status: Every Day    Current packs/day: 0.75    Average packs/day: 0.8 packs/day for 30.0 years (22.5 ttl pk-yrs)    Types: Cigarettes  Substance Use Topics   Alcohol use: No   Drug use: No    Review of Systems: All systems reviewed and negative except where noted in HPI.  Physical Exam: Vital signs in last 24 hours: Temp:  [97.6 F (36.4 C)-99.3 F (37.4 C)] 97.6 F (36.4 C) (02/23 0245) Pulse Rate:  [112-142] 112 (02/23 0245) Resp:  [16-37] 30 (02/23 0245) BP: (89-163)/(30-124) 106/48 (02/23  0245) SpO2:  [83 %-100 %] 91 % (02/23 0245) FiO2 (%):  [50 %-100 %] 60 % (02/23 0402) Weight:  [127 kg-134 kg] 134 kg (02/23 0245)   General: Ill appearing Eyes: Positive for scleral icterus Lungs: Intubated Heart: Tachycardic Abdomen:  Soft, distended.  Nontender, though patient is sedated Extremities: 1+ bilateral lower extremity edema Neurologic: Sedated   Intake/Output from previous day: 02/22 0701 - 02/23 0700 In: 1057.2 [I.V.:707.2; IV Piggyback:350] Out: -  Intake/Output this shift: Total I/O In: 707.2 [I.V.:707.2] Out: -   Lab Results: Recent Labs    11/13/23 1239 11/13/23 2143 11/08/2023 0338 11/15/2023 0348  WBC 12.3* 20.9* 30.4*  --   HGB 8.1* 8.2* 7.8* 7.8*  HCT 25.0* 26.4* 24.5* 23.0*  PLT 166 190 214  --    BMET Recent Labs    11/13/23 1239 11/13/23 2143 11/06/2023 0348  NA 141 144 144  K 2.6* 3.0* 3.2*  CL 107 108  --   CO2 22 14*  --   GLUCOSE 122* 130*  --   BUN 20 28*  --   CREATININE 1.36* 1.70*  --   CALCIUM 7.7* 7.4*  --    LFT Recent Labs    11/13/23 1239  PROT 6.0*  ALBUMIN 2.3*  AST 85*  ALT 22  ALKPHOS 87  BILITOT 4.4*   PT/INR Recent Labs    11/13/23 1338  LABPROT 18.8*  INR 1.6*   Hepatitis Panel No results for input(s): "HEPBSAG", "HCVAB", "HEPAIGM", "HEPBIGM" in the last 72 hours.   .    Latest Ref Rng & Units 11/03/2023    3:48 AM 10/31/2023    3:38 AM 11/13/2023    9:43 PM  CBC  WBC 4.0 - 10.5 K/uL  30.4  20.9   Hemoglobin 13.0 - 17.0 g/dL 7.8  7.8  8.2   Hematocrit 39.0 - 52.0 % 23.0  24.5  26.4   Platelets 150 - 400 K/uL  214  190     .    Latest Ref Rng & Units 10/25/2023    3:48 AM 11/13/2023    9:43 PM 11/13/2023   12:39 PM  CMP  Glucose 70 - 99 mg/dL  696  295   BUN 6 - 20 mg/dL  28  20   Creatinine 2.84 - 1.24 mg/dL  1.32  4.40   Sodium 102 - 145 mmol/L 144  144  141   Potassium 3.5 - 5.1 mmol/L 3.2  3.0  2.6   Chloride 98 - 111 mmol/L  108  107   CO2 22 - 32 mmol/L  14  22   Calcium 8.9 -  10.3 mg/dL  7.4  7.7   Total Protein 6.5 - 8.1 g/dL   6.0   Total Bilirubin 0.0 - 1.2 mg/dL   4.4   Alkaline Phos 38 - 126 U/L   87   AST 15 - 41 U/L   85   ALT 0 - 44 U/L   22    Studies/Results: DG CHEST PORT 1 VIEW Result Date: 11/11/2023 CLINICAL DATA:  Central  line placement EXAM: PORTABLE CHEST 1 VIEW COMPARISON:  11/18/2023 FINDINGS: Support Apparatus: --Endotracheal tube: Tip at the level of the clavicular heads. --Enteric tube:Tip and sideport are below the field of view. --Vascular catheter(s):Right internal jugular vein approach central venous catheter tip is in the proximal right atrium. --Other: None Left retrocardiac consolidation and small left pleural effusion. IMPRESSION: 1. Right internal jugular vein approach central venous catheter tip is in the proximal right atrium. 2. Left retrocardiac consolidation and small left pleural effusion. Electronically Signed   By: Deatra Robinson M.D.   On: 11/18/2023 03:58   DG CHEST PORT 1 VIEW Result Date: 11/03/2023 CLINICAL DATA:  Intubated EXAM: PORTABLE CHEST 1 VIEW COMPARISON:  11/13/2023 FINDINGS: Endotracheal tube and NG tube remain in place. Cardiomegaly, vascular congestion. Right upper lobe and left lower lobe airspace opacities are unchanged. No effusions or acute bony abnormality. IMPRESSION: Support devices in expected position. Right upper lobe and left lower lobe airspace opacities again noted, not significantly changed. Electronically Signed   By: Charlett Nose M.D.   On: 11/01/2023 03:16   DG Chest Portable 1 View Result Date: 11/13/2023 CLINICAL DATA:  ETT pulled back EXAM: PORTABLE CHEST 1 VIEW COMPARISON:  CT chest dated 11/13/2023 FINDINGS: Endotracheal tube terminates 2.5 cm above the carina. Enteric tube courses into the stomach. Multifocal patchy right upper and left lower lobe opacities, suspicious for pneumonia. No pleural effusion or pneumothorax. The heart is top-normal in size. IMPRESSION: Endotracheal tube terminates 2.5  cm above the carina. Multifocal patchy right upper and left lower lobe opacities, suspicious for pneumonia. Electronically Signed   By: Charline Bills M.D.   On: 11/13/2023 23:44   CT CHEST WO CONTRAST Result Date: 11/13/2023 CLINICAL DATA:  Short of breath for 4 days, intubated EXAM: CT CHEST WITHOUT CONTRAST TECHNIQUE: Multidetector CT imaging of the chest was performed following the standard protocol without IV contrast. RADIATION DOSE REDUCTION: This exam was performed according to the departmental dose-optimization program which includes automated exposure control, adjustment of the mA and/or kV according to patient size and/or use of iterative reconstruction technique. COMPARISON:  11/13/2023 FINDINGS: Cardiovascular: Unenhanced imaging of the heart is unremarkable without pericardial effusion. Normal caliber of the thoracic aorta. Evaluation of the vascular lumen is limited without IV contrast. Mediastinum/Nodes: Endotracheal tube identified, tip within the origin of the right mainstem bronchus, recommend retracting 2.5 cm. Enteric catheter passes below diaphragm, tip within the lumen of the gastric antrum. Thyroid is unremarkable. No pathologic adenopathy. Lungs/Pleura: There is complete consolidation of the left lower lobe, with superimposed air bronchograms as well as volume loss. This likely represents a combination of airspace disease and atelectasis. Areas of consolidation and volume loss within the lingula and right upper lobe favor atelectasis. No effusion or pneumothorax. Upper Abdomen: Moderate ascites within the right upper quadrant. No other acute findings. Musculoskeletal: No acute or destructive bony abnormalities. Bilateral gynecomastia. Reconstructed images demonstrate no additional findings. IMPRESSION: 1. Right mainstem intubation, with endotracheal tube tip at the origin of the right mainstem bronchus. Recommend retracting 2.5 cm. 2. Dense areas of consolidation within the right upper  lobe, lingula, and left lower lobe. Findings are consistent with a combination of pneumonia and atelectasis. 3. Upper abdominal ascites. 4. Bilateral gynecomastia. These results will be called to the ordering clinician or representative by the Radiologist Assistant, and communication documented in the PACS or [employer]. Electronically Signed   By: Sharlet Salina M.D.   On: 11/13/2023 22:47   DG Chest Portable 1  View Addendum Date: 11/13/2023 ADDENDUM REPORT: 11/13/2023 21:22 ADDENDUM: These results were called by telephone at the time of interpretation on 11/13/2023 at 9:22 pm to provider OLADAPO ADEFESO , who verbally acknowledged these results. Electronically Signed   By: Darliss Cheney M.D.   On: 11/13/2023 21:22   Result Date: 11/13/2023 CLINICAL DATA:  Intubation EXAM: PORTABLE CHEST 1 VIEW COMPARISON:  Chest x-ray 11/13/2023 FINDINGS: The tip of the endotracheal tube is in the proximal right mainstem bronchus. Enteric tube extends below the diaphragm. The heart is enlarged, unchanged. There is a new band of atelectasis in the right upper lobe. There stable patchy atelectasis/airspace disease in the left lung base. There is no pneumothorax or pleural effusion. The osseous structures are stable. IMPRESSION: 1. The tip of the endotracheal tube is in the proximal right mainstem bronchus. Recommend retraction 3 cm. 2. New band of atelectasis in the right upper lobe. 3. Stable patchy atelectasis/airspace disease in the left lung base. Electronically Signed: By: Darliss Cheney M.D. On: 11/13/2023 21:19   DG Chest 2 View Result Date: 11/13/2023 CLINICAL DATA:  Four day history of worsening shortness of breath EXAM: CHEST - 2 VIEW COMPARISON:  None Available. FINDINGS: Normal lung volumes. Left lower lobe opacity. No pleural effusion or pneumothorax. The heart size and mediastinal contours are within normal limits. No acute osseous abnormality. IMPRESSION: Left lower lobe opacity, suspicious for pneumonia.  Electronically Signed   By: Agustin Cree M.D.   On: 11/13/2023 13:30    Principal Problem:   COPD with acute exacerbation (HCC) Active Problems:   GI bleed   Acute respiratory failure with hypoxia (HCC)   Acute hypoxic respiratory failure (HCC)    Eulah Pont, M.D. @  10/27/2023, 4:41 AM

## 2023-11-20 NOTE — Progress Notes (Signed)
 Pharmacy Antibiotic Note  Chad Glass is a 53 y.o. male admitted on 11/13/2023 with pneumonia and sepsis.  Pharmacy has been consulted for change from Cefepime to Zosyn dosing. Remains on Vancomycin therapy.   SCr trending up, high vasopressors, UOP 400 cc this AM - will continue to monitor closely   Plan: Start Zosyn 3.375g IV q8h - EI Cefepime discontinued.  Reduce Vancomycin to 1500mg  IV every 24 hours.   Expected AUC: 523  Expected Css min: 13.3  SCr used: 2.03  Weight used: IBW, Vd used: 0.5 (BMI 39) Azithromycin per MD Monitor renal function and adjust further as needed May dose based on levels pending urine output  Height: 6' 0.01" (182.9 cm) Weight: 134 kg (295 lb 6.7 oz) IBW/kg (Calculated) : 77.62  Temp (24hrs), Avg:98.2 F (36.8 C), Min:96.5 F (35.8 C), Max:99.3 F (37.4 C)  Recent Labs  Lab 11/13/23 1239 11/13/23 1333 11/13/23 1629 11/13/23 2143 11/13/23 2319 11/12/2023 0259 10/25/2023 0338 11/07/2023 0350 11/12/2023 0918  WBC 12.3*  --   --  20.9*  --   --  30.4*  --  36.6*  CREATININE 1.36*  --   --  1.70*  --  2.03*  --   --   --   LATICACIDVEN  --  4.6* 5.3*  --  >9.0*  --   --  >9.0*  --     Estimated Creatinine Clearance: 59.6 mL/min (A) (by C-G formula based on SCr of 2.03 mg/dL (H)).    No Known Allergies  Antimicrobials this admission: 2/22 Vancomycin >> 2/22 Zosyn >> 2/22 Azithromycin >>  Dose adjustments this admission:   Microbiology results: 2/22 BCx >> 2/23 TA >> 2/22 MRSA PCR negative 2/22 RVP negative  Thank you for allowing pharmacy to be a part of this patient's care.  Link Snuffer, PharmD, BCPS, BCCCP Please refer to [facility] for [facility] numbers 11/10/2023 9:58 AM

## 2023-11-20 NOTE — Progress Notes (Signed)
 11/12/2023 Interim note Refractory progressive increasing shock some mix of septic and hemorrhagic No obvious bleeding but + varices on EGD so clipping done Methylene blue and repeat round of labs ordered Vent adjusted to maximize ventilation Increase ceiling on levophed Stress steroids, additional bicarb, calcium Echo looks okay Son is medical decision maker, coming in to discuss GOC.  Additional cc tie 90 mins  Myrla Halsted MD PCCM

## 2023-11-20 NOTE — Interval H&P Note (Signed)
 History and Physical Interval Note:  11/12/2023 5:16 AM  Chad Glass  has presented today for surgery, with the diagnosis of Hematemesis, melena.  The various methods of treatment have been discussed with the patient and family. After consideration of risks, benefits and other options for treatment, the patient has consented to  Procedure(s): ESOPHAGOGASTRODUODENOSCOPY (EGD) WITH PROPOFOL (N/A) as a surgical intervention.  The patient's history has been reviewed, patient examined, no change in status, stable for surgery.  I have reviewed the patient's chart and labs.  Questions were answered to the patient's satisfaction.     Chad Glass

## 2023-11-20 NOTE — Interval H&P Note (Deleted)
 History and Physical Interval Note:  10/28/2023 5:07 AM  Chad Glass  has presented today for surgery, with the diagnosis of Hematemesis, melena.  The various methods of treatment have been discussed with the patient and family. After consideration of risks, benefits and other options for treatment, the patient has consented to  Procedure(s): ESOPHAGOGASTRODUODENOSCOPY (EGD) WITH PROPOFOL (N/A) as a surgical intervention.  The patient's history has been reviewed, patient examined, no change in status, stable for surgery.  I have reviewed the patient's chart and labs.  Questions were answered to the patient's satisfaction.     Imogene Burn

## 2023-11-20 NOTE — Procedures (Signed)
 Pt terminally extubated at this time per MD order.

## 2023-11-20 NOTE — ED Provider Notes (Signed)
 Requested by hospitalist and [facility] to assess patient.  Patient critically ill.  Persistently low maps over the last 2 to 3 hours.  He is currently on 2 vasopressors.  Did not get fluid resuscitated secondary to volume overload deviously but lactates are uptrending.  Recommend volume resuscitation for blood pressure.  He does have a peripheral pulse which would suggest that his blood pressure is likely somewhat higher than reading.  It appears that his cuff is undersized.  Requested appropriate cuff applied.  Repeat readings more reassuring.  [facility] requesting arterial line placed.  This was placed.  Patient has significant peripheral access.  He will ultimately need a central line but is tolerating peripheral pressors at this time.  To not further delay transport, central line deferred.  Physical Exam  BP (!) 130/49   Pulse (!) 121   Temp 97.7 F (36.5 C) (Axillary)   Resp (!) 22   Ht 1.829 m (6')   Wt 127 kg   SpO2 100%   BMI 37.97 kg/m   Physical Exam Critically ill, intubated Gross blood in OG tube Asynchronous with the vent  Procedures  .Critical Care  Performed by: Shon Baton, MD Authorized by: Shon Baton, MD   Critical care provider statement:    Critical care time (minutes):  31   Critical care was necessary to treat or prevent imminent or life-threatening deterioration of the following conditions:  Respiratory failure and shock   Critical care was time spent personally by me on the following activities:  Development of treatment plan with patient or surrogate, discussions with consultants, evaluation of patient's response to treatment, examination of patient, ordering and review of laboratory studies, ordering and review of radiographic studies, ordering and performing treatments and interventions, pulse oximetry, re-evaluation of patient's condition and review of old charts ARTERIAL LINE  Date/Time: 11/02/2023 1:31 AM  Performed by: Shon Baton,  MD Authorized by: Shon Baton, MD   Consent:    Consent obtained:  Emergent situation Universal protocol:    Patient identity confirmed:  Verbally with patient Indications:    Indications: hemodynamic monitoring   Pre-procedure details:    Skin preparation:  Chlorhexidine   Preparation: Patient was prepped and draped in sterile fashion   Sedation:    Sedation type:  None Anesthesia:    Anesthesia method:  None Procedure details:    Location:  R radial   Allen's test performed: no     Needle gauge:  20 G   Placement technique:  Seldinger and ultrasound guided   Number of attempts:  2 Post-procedure details:    Post-procedure:  Secured with tape, sterile dressing applied and sutured   CMS:  Normal   Procedure completion:  Tolerated   ED Course / MDM    Medical Decision Making Amount and/or Complexity of Data Reviewed Labs: ordered. Radiology: ordered.  Risk Prescription drug management. Decision regarding hospitalization.   Problem List Items Addressed This Visit   None Visit Diagnoses       Community acquired pneumonia, unspecified laterality    -  Primary   Relevant Medications   cefTRIAXone (ROCEPHIN) 2 g in sodium chloride 0.9 % 100 mL IVPB (Completed)   azithromycin (ZITHROMAX) 500 mg in sodium chloride 0.9 % 250 mL IVPB (Completed)   VENTOLIN HFA 108 (90 Base) MCG/ACT inhaler   ipratropium-albuterol (DUONEB) 0.5-2.5 (3) MG/3ML nebulizer solution 3 mL   ceFEPIme (MAXIPIME) 2 g in sodium chloride 0.9 % 100 mL IVPB   vancomycin (  VANCOCIN) IVPB 1000 mg/200 mL premix (Completed)   vancomycin (VANCOREADY) IVPB 1500 mg/300 mL (Completed)   vancomycin (VANCOREADY) IVPB 2000 mg/400 mL (Start on 10/31/2023  6:00 PM)   diphenhydrAMINE (BENADRYL) injection 25 mg (Completed)   albuterol (PROVENTIL) (2.5 MG/3ML) 0.083% nebulizer solution (Completed)   albuterol (PROVENTIL) (2.5 MG/3ML) 0.083% nebulizer solution (Completed)     Peripheral edema         Upper GI  bleed         Anemia, unspecified type         Respiratory failure with hypoxia, unspecified chronicity (HCC)                 Dicky Boer, Mayer Masker, MD 10/26/2023 [phone]

## 2023-11-20 NOTE — Progress Notes (Signed)
 [facility] Physician-Brief Progress Note Patient Name: Chad Glass DOB: 1970-10-14 MRN: [id number]   Date of Service  11/04/2023  HPI/Events of Note  53 year old male with past medical history of MDD, GERD, COPD, OSA, tobacco use who initially presented to [facility] emergency department on 2/22 with shortness of breath, hematemesis, melena. ABX started for CAP and protonix given.  GI was consulted who recommended likely needing transfer to [facility]. Transfused 1U PRBC. He was admitted to [facility] at [facility]. He had worsening lactic acidosis, became altered and agitated and required intubation.   Required volume resuscitation and initiation of vasopressors.  Transferred to [facility] for ICU admission.   eICU Interventions  Chart reviewed Resuscitation ongoing     Intervention Category Evaluation Type: New Patient Evaluation  Henry Russel, P 11/06/2023, 3:38 AM

## 2023-11-20 NOTE — Progress Notes (Signed)
  Echocardiogram 2D Echocardiogram has been performed.  Delcie Roch 11/06/2023, 9:02 AM

## 2023-11-20 NOTE — Progress Notes (Signed)
 11/01/2023 Worsening shock despite high dose pressors and methylene blue Lactate remains undetectably high Agonally breathing on vent, skin ashen Family would like natural peaceful passing Comfort measures ordered and condolences offered.  Myrla Halsted MD PCCM

## 2023-11-20 NOTE — Death Summary Note (Signed)
 DEATH SUMMARY   Patient Details  Name: Chad Glass MRN: [id number] DOB: 26-Jan-1971  Admission/Discharge Information   Admit Date:  11-15-2023  Date of Death: Date of Death: 16-Nov-2023  Time of Death: Time of Death: Nov 24, 1727  Length of Stay: 1  Referring Physician: Carmel Sacramento, NP     Diagnoses  Preliminary cause of death:  MRSA pneumonia of both lungs x 5 days  Other diagnoses: Presumed acute variceal bleeding Newly diagnosed cirrhosis with portal hypertension Septic and hemorrha2gic shock Acute renal failure Severe lactic acidosis Hx COPD, GERD, sleep apnea   Brief Hospital Course (including significant findings, care, treatment, and services provided and events leading to death)  HERRICK HARTOG is a 53 y.o. year old male who presented with multiple complaints on 11-15-2023 including shortness of breath, melena, hematemesis, anasarca, cough, fevers, fatigue.  Patient went into respiratory failure, shock and worsening lactic acidemia with massive transfusion started.  Intubated and underwent emergent EGD which showed large varices without clear signs of bleeding: they were banded regardless.  Given broad spectrum antibiotics, bicarb, IV fluids, blood, and high dose pressors without improvement.  Family discussion held regarding goals of care in light of terminal trajectory and all in agreement to allow natural peaceful passing focusing on comfort.  Tracheal aspirate post mortem grew MRSA.  Pertinent Labs and Studies  Significant Diagnostic Studies US Abdomen Limited RUQ (LIVER/GB) Result Date: 11-16-2023 CLINICAL DATA:  Cirrhosis EXAM: ULTRASOUND ABDOMEN LIMITED RIGHT UPPER QUADRANT COMPARISON:  06/25/2005 FINDINGS: Gallbladder: Evaluation is limited due to patient body habitus. Gallbladder is only minimally distended, with no evidence of shadowing gallstones. No gallbladder wall thickening or pericholecystic fluid. The patient is unresponsive, and sonographic Murphy sign cannot be  assessed. Common bile duct: Diameter: 3 mm Liver: There is subtle nodularity of the liver capsule, with coarsened heterogeneous liver echotexture compatible with cirrhosis. No focal parenchymal abnormalities. No intrahepatic duct dilation. Portal vein is patent on color Doppler imaging with normal direction of blood flow towards the liver. Other: None. IMPRESSION: 1. Limited study due to patient body habitus. 2. Cirrhosis. 3. No acute process. Electronically Signed   By: Sharlet Salina M.D.   On: 16-Nov-2023 14:50   ECHOCARDIOGRAM COMPLETE Result Date: 11/16/2023    ECHOCARDIOGRAM REPORT   Patient Name:   JAHID WEIDA Date of Exam: November 16, 2023 Medical Rec #:  [id number]       Height:       72.0 in Accession #:    [id number]      Weight:       295.4 lb Date of Birth:  1971-01-16       BSA:          2.515 m Patient Age:    53 years        BP:           147/58 mmHg Patient Gender: M               HR:           117 bpm. Exam Location:  Inpatient Procedure: 2D Echo (Both Spectral and Color Flow Doppler were utilized during            procedure). STAT ECHO Indications:    congestive heart failure  History:        Patient has no prior history of Echocardiogram examinations.                 COPD; Risk Factors:Current Smoker.  Sonographer:    Leotis Shames  Buford Dresser RDCS Referring Phys: [id number] [facility]  Sonographer Comments: Patient is obese. Image acquisition challenging due to patient body habitus. IMPRESSIONS  1. Left ventricular ejection fraction, by estimation, is 60 to 65%. The left ventricle has normal function. The left ventricle has no regional wall motion abnormalities. Left ventricular diastolic parameters were normal.  2. Right ventricular systolic function is hyperdynamic. The right ventricular size is normal.  3. Left atrial size was mild to moderately dilated.  4. Right atrial size was mildly dilated.  5. The mitral valve is normal in structure. No evidence of mitral valve regurgitation. No evidence of  mitral stenosis.  6. The aortic valve is normal in structure. Aortic valve regurgitation is not visualized. No aortic stenosis is present.  7. The inferior vena cava is normal in size with greater than 50% respiratory variability, suggesting right atrial pressure of 3 mmHg. Comparison(s): There is increased flow across all cardiac valves consistent with high cardiac output. Consider sepsis, anemia, thyrotoxicosis, etc. FINDINGS  Left Ventricle: Left ventricular ejection fraction, by estimation, is 60 to 65%. The left ventricle has normal function. The left ventricle has no regional wall motion abnormalities. Strain imaging was not performed. The left ventricular internal cavity  size was normal in size. There is no left ventricular hypertrophy. Left ventricular diastolic parameters were normal. Right Ventricle: The right ventricular size is normal. No increase in right ventricular wall thickness. Right ventricular systolic function is hyperdynamic. Left Atrium: Left atrial size was mild to moderately dilated. Right Atrium: Right atrial size was mildly dilated. Pericardium: There is no evidence of pericardial effusion. Mitral Valve: The mitral valve is normal in structure. No evidence of mitral valve regurgitation. No evidence of mitral valve stenosis. Tricuspid Valve: The tricuspid valve is normal in structure. Tricuspid valve regurgitation is not demonstrated. No evidence of tricuspid stenosis. Aortic Valve: The aortic valve is normal in structure. Aortic valve regurgitation is not visualized. No aortic stenosis is present. Pulmonic Valve: The pulmonic valve was normal in structure. Pulmonic valve regurgitation is not visualized. No evidence of pulmonic stenosis. Aorta: The aortic root is normal in size and structure. Venous: The inferior vena cava is normal in size with greater than 50% respiratory variability, suggesting right atrial pressure of 3 mmHg. IAS/Shunts: No atrial level shunt detected by color flow  Doppler. Additional Comments: 3D imaging was not performed.  LEFT VENTRICLE PLAX 2D LVIDd:         5.40 cm   Diastology LVIDs:         3.30 cm   LV e' medial:    11.00 cm/s LV PW:         0.90 cm   LV E/e' medial:  14.8 LV IVS:        0.90 cm   LV e' lateral:   12.10 cm/s LVOT diam:     2.10 cm   LV E/e' lateral: 13.5 LV SV:         85 LV SV Index:   34 LVOT Area:     3.46 cm  RIGHT VENTRICLE RV Basal diam:  3.20 cm RV S prime:     18.40 cm/s TAPSE (M-mode): 3.1 cm LEFT ATRIUM             Index        RIGHT ATRIUM           Index LA diam:        4.90 cm 1.95 cm/m   RA Area:  19.70 cm LA Vol (A2C):   83.2 ml 33.09 ml/m  RA Volume:   56.90 ml  22.63 ml/m LA Vol (A4C):   86.9 ml 34.56 ml/m LA Biplane Vol: 85.0 ml 33.80 ml/m  AORTIC VALVE LVOT Vmax:   166.00 cm/s LVOT Vmean:  115.000 cm/s LVOT VTI:    0.244 m  AORTA Ao Root diam: 2.90 cm Ao Asc diam:  3.00 cm MITRAL VALVE MV Area (PHT): 6.12 cm     SHUNTS MV Decel Time: 124 msec     Systemic VTI:  0.24 m MV E velocity: 163.00 cm/s  Systemic Diam: 2.10 cm MV A velocity: 140.00 cm/s MV E/A ratio:  1.16 Mihai Croitoru MD Electronically signed by Thurmon Fair MD Signature Date/Time: 10/24/2023/9:39:04 AM    Final    DG CHEST PORT 1 VIEW Result Date: 11/09/2023 CLINICAL DATA:  Ventilator dependent respiratory failure. [id number]. EXAM: PORTABLE CHEST 1 VIEW COMPARISON:  Earlier study today at 3:46 a.m. FINDINGS: 5:03 a.m. ETT tip is 2.9 cm from the carina, right IJ line at the superior cavoatrial junction and NGT enters the stomach with the full intragastric course not seen. The heart is enlarged. There is mild central vascular prominence without overt edema. There is perihilar linear atelectasis both lungs and dense left lower lobe consolidation with a small left pleural effusion. The remaining lung fields are clear. Overall aeration seems unchanged. The mediastinum is stable.  No new osseous findings. IMPRESSION: 1. No significant change in aeration since the  earlier study. 2. Cardiomegaly with mild central vascular prominence but no overt edema. 3. Dense left lower lobe consolidation with small left pleural effusion. 4. Support apparatus as above. Electronically Signed   By: Almira Bar M.D.   On: 10/29/2023 07:43   DG CHEST PORT 1 VIEW Result Date: 10/23/2023 CLINICAL DATA:  Central line placement EXAM: PORTABLE CHEST 1 VIEW COMPARISON:  11/07/2023 FINDINGS: Support Apparatus: --Endotracheal tube: Tip at the level of the clavicular heads. --Enteric tube:Tip and sideport are below the field of view. --Vascular catheter(s):Right internal jugular vein approach central venous catheter tip is in the proximal right atrium. --Other: None Left retrocardiac consolidation and small left pleural effusion. IMPRESSION: 1. Right internal jugular vein approach central venous catheter tip is in the proximal right atrium. 2. Left retrocardiac consolidation and small left pleural effusion. Electronically Signed   By: Deatra Robinson M.D.   On: 11/08/2023 03:58   DG CHEST PORT 1 VIEW Result Date: 11/16/2023 CLINICAL DATA:  Intubated EXAM: PORTABLE CHEST 1 VIEW COMPARISON:  11/13/2023 FINDINGS: Endotracheal tube and NG tube remain in place. Cardiomegaly, vascular congestion. Right upper lobe and left lower lobe airspace opacities are unchanged. No effusions or acute bony abnormality. IMPRESSION: Support devices in expected position. Right upper lobe and left lower lobe airspace opacities again noted, not significantly changed. Electronically Signed   By: Charlett Nose M.D.   On: 11/01/2023 03:16   DG Chest Portable 1 View Result Date: 11/13/2023 CLINICAL DATA:  ETT pulled back EXAM: PORTABLE CHEST 1 VIEW COMPARISON:  CT chest dated 11/13/2023 FINDINGS: Endotracheal tube terminates 2.5 cm above the carina. Enteric tube courses into the stomach. Multifocal patchy right upper and left lower lobe opacities, suspicious for pneumonia. No pleural effusion or pneumothorax. The heart is  top-normal in size. IMPRESSION: Endotracheal tube terminates 2.5 cm above the carina. Multifocal patchy right upper and left lower lobe opacities, suspicious for pneumonia. Electronically Signed   By: Charline Bills M.D.   On: 11/13/2023 23:44  CT CHEST WO CONTRAST Result Date: 11/13/2023 CLINICAL DATA:  Short of breath for 4 days, intubated EXAM: CT CHEST WITHOUT CONTRAST TECHNIQUE: Multidetector CT imaging of the chest was performed following the standard protocol without IV contrast. RADIATION DOSE REDUCTION: This exam was performed according to the departmental dose-optimization program which includes automated exposure control, adjustment of the mA and/or kV according to patient size and/or use of iterative reconstruction technique. COMPARISON:  11/13/2023 FINDINGS: Cardiovascular: Unenhanced imaging of the heart is unremarkable without pericardial effusion. Normal caliber of the thoracic aorta. Evaluation of the vascular lumen is limited without IV contrast. Mediastinum/Nodes: Endotracheal tube identified, tip within the origin of the right mainstem bronchus, recommend retracting 2.5 cm. Enteric catheter passes below diaphragm, tip within the lumen of the gastric antrum. Thyroid is unremarkable. No pathologic adenopathy. Lungs/Pleura: There is complete consolidation of the left lower lobe, with superimposed air bronchograms as well as volume loss. This likely represents a combination of airspace disease and atelectasis. Areas of consolidation and volume loss within the lingula and right upper lobe favor atelectasis. No effusion or pneumothorax. Upper Abdomen: Moderate ascites within the right upper quadrant. No other acute findings. Musculoskeletal: No acute or destructive bony abnormalities. Bilateral gynecomastia. Reconstructed images demonstrate no additional findings. IMPRESSION: 1. Right mainstem intubation, with endotracheal tube tip at the origin of the right mainstem bronchus. Recommend  retracting 2.5 cm. 2. Dense areas of consolidation within the right upper lobe, lingula, and left lower lobe. Findings are consistent with a combination of pneumonia and atelectasis. 3. Upper abdominal ascites. 4. Bilateral gynecomastia. These results will be called to the ordering clinician or representative by the Radiologist Assistant, and communication documented in the PACS or [employer]. Electronically Signed   By: Sharlet Salina M.D.   On: 11/13/2023 22:47   DG Chest Portable 1 View Addendum Date: 11/13/2023 ADDENDUM REPORT: 11/13/2023 21:22 ADDENDUM: These results were called by telephone at the time of interpretation on 11/13/2023 at 9:22 pm to provider OLADAPO ADEFESO , who verbally acknowledged these results. Electronically Signed   By: Darliss Cheney M.D.   On: 11/13/2023 21:22   Result Date: 11/13/2023 CLINICAL DATA:  Intubation EXAM: PORTABLE CHEST 1 VIEW COMPARISON:  Chest x-ray 11/13/2023 FINDINGS: The tip of the endotracheal tube is in the proximal right mainstem bronchus. Enteric tube extends below the diaphragm. The heart is enlarged, unchanged. There is a new band of atelectasis in the right upper lobe. There stable patchy atelectasis/airspace disease in the left lung base. There is no pneumothorax or pleural effusion. The osseous structures are stable. IMPRESSION: 1. The tip of the endotracheal tube is in the proximal right mainstem bronchus. Recommend retraction 3 cm. 2. New band of atelectasis in the right upper lobe. 3. Stable patchy atelectasis/airspace disease in the left lung base. Electronically Signed: By: Darliss Cheney M.D. On: 11/13/2023 21:19   DG Chest 2 View Result Date: 11/13/2023 CLINICAL DATA:  Four day history of worsening shortness of breath EXAM: CHEST - 2 VIEW COMPARISON:  None Available. FINDINGS: Normal lung volumes. Left lower lobe opacity. No pleural effusion or pneumothorax. The heart size and mediastinal contours are within normal limits. No acute osseous  abnormality. IMPRESSION: Left lower lobe opacity, suspicious for pneumonia. Electronically Signed   By: Agustin Cree M.D.   On: 11/13/2023 13:30    Microbiology Recent Results (from the past 240 hours)  Resp panel by RT-PCR (RSV, Flu A&B, Covid) Anterior Nasal Swab     Status: None   Collection  Time: 11/13/23 12:27 PM   Specimen: Anterior Nasal Swab  Result Value Ref Range Status   SARS Coronavirus 2 by RT PCR NEGATIVE NEGATIVE Final    Comment: (NOTE) SARS-CoV-2 target nucleic acids are NOT DETECTED.  The SARS-CoV-2 RNA is generally detectable in upper respiratory specimens during the acute phase of infection. The lowest concentration of SARS-CoV-2 viral copies this assay can detect is 138 copies/mL. A negative result does not preclude SARS-Cov-2 infection and should not be used as the sole basis for treatment or other patient management decisions. A negative result may occur with  improper specimen collection/handling, submission of specimen other than nasopharyngeal swab, presence of viral mutation(s) within the areas targeted by this assay, and inadequate number of viral copies(<138 copies/mL). A negative result must be combined with clinical observations, patient history, and epidemiological information. The expected result is Negative.  Fact Sheet for Patients:  BloggerCourse.com  Fact Sheet for Healthcare Providers:  SeriousBroker.it  This test is no t yet approved or cleared by the [place] FDA and  has been authorized for detection and/or diagnosis of SARS-CoV-2 by FDA under an Emergency Use Authorization (EUA). This EUA will remain  in effect (meaning this test can be used) for the duration of the COVID-19 declaration under Section 564(b)(1) of the Act, 21 U.S.C.section 360bbb-3(b)(1), unless the authorization is terminated  or revoked sooner.       Influenza A by PCR NEGATIVE NEGATIVE Final   Influenza B by PCR  NEGATIVE NEGATIVE Final    Comment: (NOTE) The Xpert Xpress SARS-CoV-2/FLU/RSV plus assay is intended as an aid in the diagnosis of influenza from Nasopharyngeal swab specimens and should not be used as a sole basis for treatment. Nasal washings and aspirates are unacceptable for Xpert Xpress SARS-CoV-2/FLU/RSV testing.  Fact Sheet for Patients: BloggerCourse.com  Fact Sheet for Healthcare Providers: SeriousBroker.it  This test is not yet approved or cleared by the [place] FDA and has been authorized for detection and/or diagnosis of SARS-CoV-2 by FDA under an Emergency Use Authorization (EUA). This EUA will remain in effect (meaning this test can be used) for the duration of the COVID-19 declaration under Section 564(b)(1) of the Act, 21 U.S.C. section 360bbb-3(b)(1), unless the authorization is terminated or revoked.     Resp Syncytial Virus by PCR NEGATIVE NEGATIVE Final    Comment: (NOTE) Fact Sheet for Patients: BloggerCourse.com  Fact Sheet for Healthcare Providers: SeriousBroker.it  This test is not yet approved or cleared by the [place] FDA and has been authorized for detection and/or diagnosis of SARS-CoV-2 by FDA under an Emergency Use Authorization (EUA). This EUA will remain in effect (meaning this test can be used) for the duration of the COVID-19 declaration under Section 564(b)(1) of the Act, 21 U.S.C. section 360bbb-3(b)(1), unless the authorization is terminated or revoked.  Performed at [facility], [address], [place], [place] [address]   Culture, blood (routine x 2)     Status: None (Preliminary result)   Collection Time: 11/13/23  1:33 PM   Specimen: BLOOD  Result Value Ref Range Status   Specimen Description BLOOD BLOOD LEFT ARM  Final   Special Requests   Final    BOTTLES DRAWN AEROBIC AND ANAEROBIC Blood Culture adequate volume    Culture   Final    NO GROWTH 3 DAYS Performed at [facility], [address], [place], [place] [address]    Report Status PENDING  Incomplete  Culture, blood (routine x 2)  Status: None (Preliminary result)   Collection Time: 11/13/23  1:33 PM   Specimen: BLOOD  Result Value Ref Range Status   Specimen Description BLOOD BLOOD LEFT HAND  Final   Special Requests   Final    BOTTLES DRAWN AEROBIC AND ANAEROBIC Blood Culture adequate volume   Culture   Final    NO GROWTH 3 DAYS Performed at [facility], [address], [place], [place] [address]    Report Status PENDING  Incomplete  Respiratory (~20 pathogens) panel by PCR     Status: None   Collection Time: 11/13/23  3:50 PM   Specimen: Nasopharyngeal Swab; Respiratory  Result Value Ref Range Status   Adenovirus NOT DETECTED NOT DETECTED Final   Coronavirus 229E NOT DETECTED NOT DETECTED Final    Comment: (NOTE) The Coronavirus on the Respiratory Panel, DOES NOT test for the novel  Coronavirus (2019 nCoV)    Coronavirus HKU1 NOT DETECTED NOT DETECTED Final   Coronavirus NL63 NOT DETECTED NOT DETECTED Final   Coronavirus OC43 NOT DETECTED NOT DETECTED Final   Metapneumovirus NOT DETECTED NOT DETECTED Final   Rhinovirus / Enterovirus NOT DETECTED NOT DETECTED Final   Influenza A NOT DETECTED NOT DETECTED Final   Influenza B NOT DETECTED NOT DETECTED Final   Parainfluenza Virus 1 NOT DETECTED NOT DETECTED Final   Parainfluenza Virus 2 NOT DETECTED NOT DETECTED Final   Parainfluenza Virus 3 NOT DETECTED NOT DETECTED Final   Parainfluenza Virus 4 NOT DETECTED NOT DETECTED Final   Respiratory Syncytial Virus NOT DETECTED NOT DETECTED Final   Bordetella pertussis NOT DETECTED NOT DETECTED Final   Bordetella Parapertussis NOT DETECTED NOT DETECTED Final   Chlamydophila pneumoniae NOT DETECTED NOT DETECTED Final   Mycoplasma pneumoniae NOT DETECTED NOT DETECTED Final    Comment: Performed at [facility], [address]  [place], [place] [address]  MRSA Next Gen by PCR, Nasal     Status: None   Collection Time: 11/13/23  9:29 PM   Specimen: Nasal Swab  Result Value Ref Range Status   MRSA by PCR Next Gen NOT DETECTED NOT DETECTED Final    Comment: (NOTE) The GeneXpert MRSA Assay (FDA approved for NASAL specimens only), is one component of a comprehensive MRSA colonization surveillance program. It is not intended to diagnose MRSA infection nor to guide or monitor treatment for MRSA infections. Test performance is not FDA approved in patients less than 10 years old. Performed at [facility], [address] [place], [place] [address]   Culture, Respiratory w Gram Stain     Status: None   Collection Time: 11/04/2023  4:03 AM   Specimen: Tracheal Aspirate; Respiratory  Result Value Ref Range Status   Specimen Description TRACHEAL ASPIRATE  Final   Special Requests NONE  Final   Gram Stain   Final    FEW WBC PRESENT, PREDOMINANTLY PMN FEW GRAM POSITIVE COCCI RARE GRAM POSITIVE RODS Performed at [facility], [address] [place], [place] [address]    Culture MODERATE STAPHYLOCOCCUS AUREUS  Final   Report Status 11/16/2023 FINAL  Final   Organism ID, Bacteria STAPHYLOCOCCUS AUREUS  Final      Susceptibility   Staphylococcus aureus - MIC*    CIPROFLOXACIN <=0.5 SENSITIVE Sensitive     ERYTHROMYCIN >=8 RESISTANT Resistant     GENTAMICIN <=0.5 SENSITIVE Sensitive     OXACILLIN 0.5 SENSITIVE Sensitive     TETRACYCLINE <=1 SENSITIVE Sensitive     VANCOMYCIN 1 SENSITIVE  Sensitive     TRIMETH/SULFA <=10 SENSITIVE Sensitive     CLINDAMYCIN RESISTANT Resistant     RIFAMPIN <=0.5 SENSITIVE Sensitive     Inducible Clindamycin POSITIVE Resistant     LINEZOLID 2 SENSITIVE Sensitive     * MODERATE STAPHYLOCOCCUS AUREUS    Lab Basic Metabolic Panel: Recent Labs  Lab 11/13/23 1239 11/13/23 1358 11/13/23 2143 11/02/2023 0259 11/16/2023 0348 11/16/2023 0918 10/28/2023 0931 11/18/2023 1058  NA 141  --  144  147* 144 143 144 143  K 2.6*  --  3.0* 3.1* 3.2* 3.6 3.7 3.3*  CL 107  --  108 103  --  104  --   --   CO2 22  --  14* 16*  --  16*  --   --   GLUCOSE 122*  --  130* 104*  --  180*  --   --   BUN 20  --  28* 27*  --  29*  --   --   CREATININE 1.36*  --  1.70* 2.03*  --  1.90*  --   --   CALCIUM 7.7*  --  7.4* 7.2*  --  7.1*  --   --   MG  --  1.8  --   --   --   --   --   --    Liver Function Tests: Recent Labs  Lab 11/13/23 1239 10/28/2023 0259  AST 85* 129*  ALT 22 28  ALKPHOS 87 70  BILITOT 4.4* 4.0*  PROT 6.0* 5.1*  ALBUMIN 2.3* 1.9*   No results for input(s): "LIPASE", "AMYLASE" in the last 168 hours. No results for input(s): "AMMONIA" in the last 168 hours. CBC: Recent Labs  Lab 11/13/23 1239 11/13/23 2143 11/19/2023 0338 11/17/2023 0348 11/17/2023 0918 11/07/2023 0931 11/05/2023 1058 11/15/2023 1150  WBC 12.3* 20.9* 30.4*  --  36.6*  --   --  38.4*  NEUTROABS 8.1*  --   --   --   --   --   --   --   HGB 8.1* 8.2* 7.8* 7.8* 8.3* 8.8* 8.5* 8.3*  HCT 25.0* 26.4* 24.5* 23.0* 26.2* 26.0* 25.0* 25.6*  MCV 94.3 99.6 98.4  --  97.4  --   --  95.2  PLT 166 190 214  --  242  --   --  239   Cardiac Enzymes: No results for input(s): "CKTOTAL", "CKMB", "CKMBINDEX", "TROPONINI" in the last 168 hours. Sepsis Labs: Recent Labs  Lab 11/13/23 2143 11/13/23 2319 11/03/2023 0338 10/26/2023 0350 10/27/2023 0918 10/31/2023 1150 10/30/2023 1320  WBC 20.9*  --  30.4*  --  36.6* 38.4*  --   LATICACIDVEN  --  >9.0*  --  >9.0* >9.0*  --  >9.0*     Lorin Glass 11/16/2023, 3:49 PM

## 2023-11-20 NOTE — H&P (View-Only) (Signed)
 Referring Provider:           Primary Care Physician:  Carmel Sacramento, NP Primary Gastroenterologist:             Reason for Consultation:                   ASSESSMENT /  PLAN    Hematemesis Melena Anemia - EGD today in ICU   HPI:     Chad Glass is a 53 y.o. male with history of OSA on CPAP, tobacco use, GERD, COPD, anxiety, and depression presented with shortness of breath, hematemesis, and melena.  He also reports swelling in his legs that has been worsening over time.  Shortness of breath started a few days ago and multiple family members have been sick as well.  He started having 3-4 episodes of hematemesis per day over the last 4 days.  Has also had melena and diarrhea over that course of time.  He has never had an EGD or colonoscopy in the past.  He has a history of heavy alcohol use but that he quit drinking alcohol over a year ago.    He was noted to be hypoxic upon arrival.  Labs were notable for elevated AST of 85, elevated total bilirubin of 4.4, hemoglobin of 8.1, decreased platelets of 166.  Chest x-ray showed left lower lobe opacity consistent with pneumonia.  Chest CT without contrast showed several areas of pneumonia in the right upper lobe, lingula, and left lower lobe.  He was also noted to have upper abdominal ascites.  Then patient became altered so he was intubated and sedated.  He was also hypotensive requiring initiation of vasopressors.   Past Medical History:  Diagnosis Date   Anxiety    Chronic back pain    Chronic neck pain    Chronic obstructive pulmonary disease, unspecified (HCC)    Depression    Gastro-esophageal reflux disease with esophagitis    GERD (gastroesophageal reflux disease)    Low back pain    Major depressive disorder, single episode, unspecified    Other hemorrhoids    Sleep apnea    CiPaP at night   Sleep apnea, unspecified     Past Surgical History:  Procedure Laterality Date   KNEE SURGERY     right-cut around the  cartilage. Dr. Romeo Apple at [facility]   UMBILICAL HERNIA REPAIR  11/30/2011   Procedure: HERNIA REPAIR UMBILICAL ADULT;  Surgeon: Fabio Bering, MD;  Location: [facility];  Service: General;  Laterality: N/A;    Prior to Admission medications   Medication Sig Start Date End Date Taking? Authorizing Provider  oxyCODONE (ROXICODONE) 15 MG immediate release tablet Take 15 mg by mouth 4 (four) times daily as needed for pain.   Yes [provider]  pregabalin (LYRICA) 100 MG capsule Take 100 mg by mouth daily. 11/09/23  Yes [provider]  VENTOLIN HFA 108 (90 Base) MCG/ACT inhaler Inhale 1 puff into the lungs every 6 (six) hours as needed for wheezing or shortness of breath. 10/22/23  Yes [provider]  cetirizine (ZYRTEC) 10 MG tablet Take 10 mg by mouth daily.    [provider]  clonazePAM (KLONOPIN) 0.5 MG tablet Take 0.5 mg by mouth 2 (two) times daily as needed for anxiety. Patient not taking: Reported on 11/13/2023 04/06/22   [provider]  cyclobenzaprine (FLEXERIL) 5 MG tablet Take 5 mg by mouth 3 (three) times daily as needed for muscle spasms.  [provider]  DULoxetine (CYMBALTA) 30 MG capsule Take 90 mg by mouth daily.    [provider]  hydrOXYzine (ATARAX) 25 MG tablet Take 25 mg by mouth 2 (two) times daily. Patient not taking: Reported on 11/13/2023 09/07/23   [provider]  pantoprazole (PROTONIX) 40 MG tablet Take 1 tablet (40 mg total) by mouth 2 (two) times daily before a meal. 06/01/22   Aida Raider, NP  propranolol ER (INDERAL LA) 120 MG 24 hr capsule Take 120 mg by mouth daily. 07/07/22   [provider]  sertraline (ZOLOFT) 50 MG tablet Take 50 mg by mouth daily. 01/23/22   [provider]  Sod Picosulfate-Mag Ox-Cit Acd (CLENPIQ) 10-3.5-12 MG-GM -GM/175ML SOLN Take 1 kit by mouth as directed. 06/22/22   Rourk, Gerrit Friends, MD  XYOSTED 75 MG/0.5ML SOAJ Inject 75 mg into the  skin every 7 (seven) days. Patient not taking: Reported on 11/13/2023 01/26/22   [provider]    Current Facility-Administered Medications  Medication Dose Route Frequency Provider Last Rate Last Admin   0.9 %  sodium chloride infusion (Manually program via Guardrails IV Fluids)   Intravenous Once Benjiman Core, MD   Held at 11/13/23 2023   0.9 %  sodium chloride infusion (Manually program via Guardrails IV Fluids)   Intravenous Once Assaker, [place], MD       0.9 %  sodium chloride infusion (Manually program via Guardrails IV Fluids)   Intravenous Once Mertha Baars E, PA-C       0.9 %  sodium chloride infusion  250 mL Intravenous Continuous Adefeso, Oladapo, DO   Stopped at 11/02/2023 0045   acetaminophen (TYLENOL) tablet 650 mg  650 mg Oral Q6H PRN Dezii, Alexandra, DO       azithromycin (ZITHROMAX) 500 mg in sodium chloride 0.9 % 250 mL IVPB  500 mg Intravenous Q24H Assaker, Jean-Pierre, MD       calcium gluconate 2 g/ 100 mL sodium chloride IVPB  2 g Intravenous Once Assaker, [place], MD 100 mL/hr at 11/11/2023 0407 2,000 mg at 10/25/2023 0407   ceFEPIme (MAXIPIME) 2 g in sodium chloride 0.9 % 100 mL IVPB  2 g Intravenous Q8H Hunt, Madison H, RPH   Stopped at 11/13/23 2020   Chlorhexidine Gluconate Cloth 2 % PADS 6 each  6 each Topical Daily Adefeso, Oladapo, DO   6 each at 11/18/2023 0354   docusate (COLACE) 50 MG/5ML liquid 100 mg  100 mg Per Tube BID PRN Assaker, [place], MD       famotidine (PEPCID) tablet 20 mg  20 mg Per Tube BID Cherlynn Polo, Lauren E, PA-C       fentaNYL (SUBLIMAZE) bolus via infusion 50-100 mcg  50-100 mcg Intravenous Q15 min PRN Cristopher Peru, PA-C       fentaNYL (SUBLIMAZE) injection 50 mcg  50 mcg Intravenous Once Autry, Lauren E, PA-C       fentaNYL in NS (45mcg/ml) infusion-PREMIX  50-200 mcg/hr Intravenous Continuous Cristopher Peru, PA-C 5 mL/hr at 11/09/2023 0403 50 mcg/hr at 11/04/2023 0403   ipratropium-albuterol (DUONEB) 0.5-2.5 (3)  MG/3ML nebulizer solution 3 mL  3 mL Nebulization Q4H Dezii, Alexandra, DO   3 mL at 10/28/2023 0321   methylPREDNISolone sodium succinate (SOLU-MEDROL) 40 mg/mL injection 40 mg  40 mg Intravenous Daily Assaker, Jean-Pierre, MD       norepinephrine (LEVOPHED) 16 mg in (0.064 mg/mL) premix infusion  0-40 mcg/min Intravenous Titrated Cristopher Peru, PA-C  23.4 mL/hr at 10/24/2023 0424 25 mcg/min at 10/27/2023 0424   octreotide (SANDOSTATIN) 500 mcg in sodium chloride 0.9 % 250 mL (2 mcg/mL) infusion  50 mcg/hr Intravenous Continuous Cristopher Peru, PA-C       Oral care mouth rinse  15 mL Mouth Rinse Q2H Adefeso, Oladapo, DO   15 mL at 11/15/2023 8657   Oral care mouth rinse  15 mL Mouth Rinse PRN Adefeso, Oladapo, DO       pantoprazole (PROTONIX) injection 40 mg  40 mg Intravenous Q12H Benjiman Core, MD       phenylephrine CONCENTRATED 100mg  in sodium chloride 0.9% (0.4mg /mL) premix infusion  25-200 mcg/min Intravenous Titrated Assaker, [place], MD   Held at 10/28/2023 0440   polyethylene glycol (MIRALAX / GLYCOLAX) packet 17 g  17 g Per Tube Daily PRN Assaker, [place], MD       potassium chloride 10 mEq in 100 mL IVPB  10 mEq Intravenous Q1 Hr x 4 Adefeso, Oladapo, DO 100 mL/hr at 10/26/2023 0358 10 mEq at 10/27/2023 0358   propofol (DIPRIVAN) 1000 MG/100ML infusion  5-80 mcg/kg/min Intravenous Titrated Adefeso, Oladapo, DO 19.05 mL/hr at 11/02/2023 0138 25 mcg/kg/min at 11/12/2023 0138   sodium bicarbonate 1 mEq/mL injection            sodium bicarbonate 150 mEq in dextrose 5 % 1,150 mL infusion   Intravenous Continuous Cristopher Peru, PA-C 125 mL/hr at 11/02/2023 0434 New Bag at 11/12/2023 0434   vancomycin (VANCOREADY) IVPB 2000 mg/400 mL  2,000 mg Intravenous Q24H Hunt, Madison H, RPH       vasopressin (PITRESSIN) 20 Units in 100 mL (0.2 unit/mL) infusion-*FOR SHOCK*  0.04 Units/min Intravenous Continuous Assaker, [place], MD 12 mL/hr at 11/02/2023 0413 0.04 Units/min at 10/27/2023 0413     Allergies as of 11/13/2023   (No Known Allergies)    Family History  Problem Relation Age of Onset   Breast cancer Mother    Arthritis Other    Diabetes Other    Anesthesia problems Neg Hx    Hypotension Neg Hx    Pseudochol deficiency Neg Hx    Malignant hyperthermia Neg Hx     Social History   Tobacco Use   Smoking status: Every Day    Current packs/day: 0.75    Average packs/day: 0.8 packs/day for 30.0 years (22.5 ttl pk-yrs)    Types: Cigarettes  Substance Use Topics   Alcohol use: No   Drug use: No    Review of Systems: All systems reviewed and negative except where noted in HPI.  Physical Exam: Vital signs in last 24 hours: Temp:  [97.6 F (36.4 C)-99.3 F (37.4 C)] 97.6 F (36.4 C) (02/23 0245) Pulse Rate:  [112-142] 112 (02/23 0245) Resp:  [16-37] 30 (02/23 0245) BP: (89-163)/(30-124) 106/48 (02/23 0245) SpO2:  [83 %-100 %] 91 % (02/23 0245) FiO2 (%):  [50 %-100 %] 60 % (02/23 0402) Weight:  [127 kg-134 kg] 134 kg (02/23 0245)   General: Ill appearing Eyes: Positive for scleral icterus Lungs: Intubated Heart: Tachycardic Abdomen:  Soft, distended.  Nontender, though patient is sedated Extremities: 1+ bilateral lower extremity edema Neurologic: Sedated   Intake/Output from previous day: 02/22 0701 - 02/23 0700 In: 1057.2 [I.V.:707.2; IV Piggyback:350] Out: -  Intake/Output this shift: Total I/O In: 707.2 [I.V.:707.2] Out: -   Lab Results: Recent Labs    11/13/23 1239 11/13/23 2143 11/11/2023 0338 11/02/2023 0348  WBC 12.3* 20.9* 30.4*  --   HGB 8.1*  8.2* 7.8* 7.8*  HCT 25.0* 26.4* 24.5* 23.0*  PLT 166 190 214  --    BMET Recent Labs    11/13/23 1239 11/13/23 2143 10/25/2023 0348  NA 141 144 144  K 2.6* 3.0* 3.2*  CL 107 108  --   CO2 22 14*  --   GLUCOSE 122* 130*  --   BUN 20 28*  --   CREATININE 1.36* 1.70*  --   CALCIUM 7.7* 7.4*  --    LFT Recent Labs    11/13/23 1239  PROT 6.0*  ALBUMIN 2.3*  AST 85*  ALT 22   ALKPHOS 87  BILITOT 4.4*   PT/INR Recent Labs    11/13/23 1338  LABPROT 18.8*  INR 1.6*   Hepatitis Panel No results for input(s): "HEPBSAG", "HCVAB", "HEPAIGM", "HEPBIGM" in the last 72 hours.   .    Latest Ref Rng & Units 10/27/2023    3:48 AM 11/10/2023    3:38 AM 11/13/2023    9:43 PM  CBC  WBC 4.0 - 10.5 K/uL  30.4  20.9   Hemoglobin 13.0 - 17.0 g/dL 7.8  7.8  8.2   Hematocrit 39.0 - 52.0 % 23.0  24.5  26.4   Platelets 150 - 400 K/uL  214  190     .    Latest Ref Rng & Units 11/18/2023    3:48 AM 11/13/2023    9:43 PM 11/13/2023   12:39 PM  CMP  Glucose 70 - 99 mg/dL  130  865   BUN 6 - 20 mg/dL  28  20   Creatinine 7.84 - 1.24 mg/dL  6.96  2.95   Sodium 284 - 145 mmol/L 144  144  141   Potassium 3.5 - 5.1 mmol/L 3.2  3.0  2.6   Chloride 98 - 111 mmol/L  108  107   CO2 22 - 32 mmol/L  14  22   Calcium 8.9 - 10.3 mg/dL  7.4  7.7   Total Protein 6.5 - 8.1 g/dL   6.0   Total Bilirubin 0.0 - 1.2 mg/dL   4.4   Alkaline Phos 38 - 126 U/L   87   AST 15 - 41 U/L   85   ALT 0 - 44 U/L   22    Studies/Results: DG CHEST PORT 1 VIEW Result Date: 11/12/2023 CLINICAL DATA:  Central line placement EXAM: PORTABLE CHEST 1 VIEW COMPARISON:  10/26/2023 FINDINGS: Support Apparatus: --Endotracheal tube: Tip at the level of the clavicular heads. --Enteric tube:Tip and sideport are below the field of view. --Vascular catheter(s):Right internal jugular vein approach central venous catheter tip is in the proximal right atrium. --Other: None Left retrocardiac consolidation and small left pleural effusion. IMPRESSION: 1. Right internal jugular vein approach central venous catheter tip is in the proximal right atrium. 2. Left retrocardiac consolidation and small left pleural effusion. Electronically Signed   By: Deatra Robinson M.D.   On: 10/30/2023 03:58   DG CHEST PORT 1 VIEW Result Date: 11/04/2023 CLINICAL DATA:  Intubated EXAM: PORTABLE CHEST 1 VIEW COMPARISON:  11/13/2023 FINDINGS:  Endotracheal tube and NG tube remain in place. Cardiomegaly, vascular congestion. Right upper lobe and left lower lobe airspace opacities are unchanged. No effusions or acute bony abnormality. IMPRESSION: Support devices in expected position. Right upper lobe and left lower lobe airspace opacities again noted, not significantly changed. Electronically Signed   By: Charlett Nose M.D.   On: 10/25/2023 03:16   DG Chest  Portable 1 View Result Date: 11/13/2023 CLINICAL DATA:  ETT pulled back EXAM: PORTABLE CHEST 1 VIEW COMPARISON:  CT chest dated 11/13/2023 FINDINGS: Endotracheal tube terminates 2.5 cm above the carina. Enteric tube courses into the stomach. Multifocal patchy right upper and left lower lobe opacities, suspicious for pneumonia. No pleural effusion or pneumothorax. The heart is top-normal in size. IMPRESSION: Endotracheal tube terminates 2.5 cm above the carina. Multifocal patchy right upper and left lower lobe opacities, suspicious for pneumonia. Electronically Signed   By: Charline Bills M.D.   On: 11/13/2023 23:44   CT CHEST WO CONTRAST Result Date: 11/13/2023 CLINICAL DATA:  Short of breath for 4 days, intubated EXAM: CT CHEST WITHOUT CONTRAST TECHNIQUE: Multidetector CT imaging of the chest was performed following the standard protocol without IV contrast. RADIATION DOSE REDUCTION: This exam was performed according to the departmental dose-optimization program which includes automated exposure control, adjustment of the mA and/or kV according to patient size and/or use of iterative reconstruction technique. COMPARISON:  11/13/2023 FINDINGS: Cardiovascular: Unenhanced imaging of the heart is unremarkable without pericardial effusion. Normal caliber of the thoracic aorta. Evaluation of the vascular lumen is limited without IV contrast. Mediastinum/Nodes: Endotracheal tube identified, tip within the origin of the right mainstem bronchus, recommend retracting 2.5 cm. Enteric catheter passes below  diaphragm, tip within the lumen of the gastric antrum. Thyroid is unremarkable. No pathologic adenopathy. Lungs/Pleura: There is complete consolidation of the left lower lobe, with superimposed air bronchograms as well as volume loss. This likely represents a combination of airspace disease and atelectasis. Areas of consolidation and volume loss within the lingula and right upper lobe favor atelectasis. No effusion or pneumothorax. Upper Abdomen: Moderate ascites within the right upper quadrant. No other acute findings. Musculoskeletal: No acute or destructive bony abnormalities. Bilateral gynecomastia. Reconstructed images demonstrate no additional findings. IMPRESSION: 1. Right mainstem intubation, with endotracheal tube tip at the origin of the right mainstem bronchus. Recommend retracting 2.5 cm. 2. Dense areas of consolidation within the right upper lobe, lingula, and left lower lobe. Findings are consistent with a combination of pneumonia and atelectasis. 3. Upper abdominal ascites. 4. Bilateral gynecomastia. These results will be called to the ordering clinician or representative by the Radiologist Assistant, and communication documented in the PACS or [employer]. Electronically Signed   By: Sharlet Salina M.D.   On: 11/13/2023 22:47   DG Chest Portable 1 View Addendum Date: 11/13/2023 ADDENDUM REPORT: 11/13/2023 21:22 ADDENDUM: These results were called by telephone at the time of interpretation on 11/13/2023 at 9:22 pm to provider OLADAPO ADEFESO , who verbally acknowledged these results. Electronically Signed   By: Darliss Cheney M.D.   On: 11/13/2023 21:22   Result Date: 11/13/2023 CLINICAL DATA:  Intubation EXAM: PORTABLE CHEST 1 VIEW COMPARISON:  Chest x-ray 11/13/2023 FINDINGS: The tip of the endotracheal tube is in the proximal right mainstem bronchus. Enteric tube extends below the diaphragm. The heart is enlarged, unchanged. There is a new band of atelectasis in the right upper lobe. There  stable patchy atelectasis/airspace disease in the left lung base. There is no pneumothorax or pleural effusion. The osseous structures are stable. IMPRESSION: 1. The tip of the endotracheal tube is in the proximal right mainstem bronchus. Recommend retraction 3 cm. 2. New band of atelectasis in the right upper lobe. 3. Stable patchy atelectasis/airspace disease in the left lung base. Electronically Signed: By: Darliss Cheney M.D. On: 11/13/2023 21:19   DG Chest 2 View Result Date: 11/13/2023 CLINICAL DATA:  Four day history of worsening shortness of breath EXAM: CHEST - 2 VIEW COMPARISON:  None Available. FINDINGS: Normal lung volumes. Left lower lobe opacity. No pleural effusion or pneumothorax. The heart size and mediastinal contours are within normal limits. No acute osseous abnormality. IMPRESSION: Left lower lobe opacity, suspicious for pneumonia. Electronically Signed   By: Agustin Cree M.D.   On: 11/13/2023 13:30    Principal Problem:   COPD with acute exacerbation (HCC) Active Problems:   GI bleed   Acute respiratory failure with hypoxia (HCC)   Acute hypoxic respiratory failure (HCC)    Eulah Pont, M.D. @  10/31/2023, 4:41 AM

## 2023-11-20 NOTE — IPAL (Signed)
  Interdisciplinary Goals of Care Family Meeting   Date carried out: 11/18/2023  Location of the meeting: Conference room  Member's involved: Physician, Bedside Registered Nurse, and Family Member or next of kin  Durable Power of Attorney or acting medical decision maker: son    Discussion: We discussed goals of care for KANO HECKMANN .   Discussed acute on chronic multiorgan failure, mixed septic/hemorrhagic shock. Discussed futility of CPR in this particular context. Discussed how tenuous next few hours will be.  Disposition:  Full scope but DNR if arrests If continues to deteriorate transition to comfort Check lactate around 1430 and decide on which way to go. Discussed that even if survives through day we may have tough decisions over next couple days regarding how far to push this given level of illness.  Time spent for the meeting: 15 mins    Lorin Glass, MD  11/12/2023, 11:48 AM

## 2023-11-20 NOTE — ED Notes (Signed)
 RT made aware of need for ART line

## 2023-11-20 NOTE — Progress Notes (Signed)
 Brief EGD Report: (since our [facility] endoscopy report is having difficulty autopopulating into Epic)  Findings: Large (> 5 mm) varices were found in the distal esophagus with red wale sign. There were no nipples present.  Nine bands were successfully placed with complete eradication, resulting in deflation of varices.  There was no bleeding at the end of the procedure.  A large amount of food (residue) and blood clot was found in the gastric body and antrum. This was extensively cleared and stomach did not reveal any source of bleeding. There were no gastric varices or gastric ulcers visualized.   The examined duodenum was normal.   Impression: - Large (> 5 mm) esophageal varices.  Completely eradicated.  Banded.  - A large amount of food (residue) and blood clot in the stomach. This was cleared with no source of bleeding identified in the stomach. - Normal examined duodenum.   Recommendation: - Continue ICU care. - Although no active bleeding was noted and esophageal varices did not have an obvious nipple, no other source of upper GI bleed was found. Thus I opted to treat his esophageal varices, presuming due to their large size that they were the likely source of bleeding. - Continue PPI BID, octreotide gtt for 72 hours, and broad spectrum antibiotics for 7 days - Please do not place an OGT or NGT for now. - Continue to trend his blood counts. - May need lactulose enemas to help with his mental status in the future. - Will check RUQ U/S. - Would recommend paracentesis if there is enough fluid to be able to be drained. Recommend sending of ascites fluid off for cell count, culture, total protein, and albumin. - Repeat EGD in 1 month as part of banding protocol. - The findings and recommendations were discussed with the ICU team and the patient's fiance Cheri Rous.   Patient tolerated the EGD procedure well.  Patient's hemoglobin after the procedure appears to be stable.

## 2023-11-20 DEATH — deceased
